# Patient Record
Sex: Female | Born: 1975 | Race: Black or African American | Hispanic: No | Marital: Single | Smoking: Current some day smoker
Health system: Southern US, Community
[De-identification: ages and names within clinical notes are randomized; demographics above are authoritative.]

## PROBLEM LIST (undated history)

## (undated) DIAGNOSIS — N83209 Unspecified ovarian cyst, unspecified side: Secondary | ICD-10-CM

## (undated) HISTORY — PX: ABDOMINAL HYSTERECTOMY: SHX81

## (undated) HISTORY — PX: CHOLECYSTECTOMY: SHX55

## (undated) HISTORY — PX: OVARIAN CYST SURGERY: SHX726

---

## 2011-08-26 ENCOUNTER — Encounter: Payer: Self-pay | Admitting: *Deleted

## 2011-08-26 ENCOUNTER — Emergency Department (HOSPITAL_BASED_OUTPATIENT_CLINIC_OR_DEPARTMENT_OTHER)
Admission: EM | Admit: 2011-08-26 | Discharge: 2011-08-26 | Disposition: A | Discharge status: Home / Self Care | Payer: Self-pay | Attending: Emergency Medicine | Admitting: Emergency Medicine

## 2011-08-26 DIAGNOSIS — F17200 Nicotine dependence, unspecified, uncomplicated: Secondary | ICD-10-CM | POA: Insufficient documentation

## 2011-08-26 DIAGNOSIS — J029 Acute pharyngitis, unspecified: Secondary | ICD-10-CM | POA: Insufficient documentation

## 2011-08-26 LAB — RAPID STREP SCREEN (MED CTR MEBANE ONLY): Streptococcus, Group A Screen (Direct): NEGATIVE

## 2011-08-26 MED ORDER — HYDROCODONE-ACETAMINOPHEN 7.5-500 MG/15ML PO SOLN: 10.0000 mL | ORAL | Status: AC | PRN

## 2011-08-26 MED ORDER — DEXAMETHASONE 4 MG PO TABS
10.0000 mg | ORAL_TABLET | Freq: Once | ORAL | Status: AC
  Administered 2011-08-26: 10 mg via ORAL
  Filled 2011-08-26: qty 3

## 2011-08-26 NOTE — ED Provider Notes (Signed)
History     CSN: [id number] Arrival date & time: 08/26/2011  2:59 AM  Chief Complaint  Patient presents with  . Sore Throat   HPI   History reviewed. No pertinent past medical history.  History reviewed. No pertinent past surgical history.  History reviewed. No pertinent family history.  History  Substance Use Topics  . Smoking status: Current Everyday Smoker -- 2.0 packs/day    Types: Cigarettes  . Smokeless tobacco: Not on file  . Alcohol Use: No    OB History    Grav Para Term Preterm Abortions TAB SAB Ect Mult Living                  Review of Systems  Physical Exam  BP 108/64  Pulse 72  Temp(Src) 98.2 F (36.8 C) (Oral)  Resp 18  SpO2 97%  Physical Exam  ED Course  Procedures  MDM Nursing notes and vitals signs, including pulse oximetry, reviewed.  Summary of this visit's results, reviewed by myself:  Labs:  Results for orders placed during the hospital encounter of 08/26/11  RAPID STREP SCREEN      Component Value Range   Streptococcus, Group A Screen (Direct) NEGATIVE  NEGATIVE     Imaging Studies: No results found.        Hanley Seamen, MD 08/26/11 [phone]

## 2011-08-26 NOTE — ED Notes (Signed)
Pt c/o sore throat for the last 5 days.  Minimal redness noted.  Pt is afebrile and took no otc meds pta.

## 2011-08-26 NOTE — ED Notes (Signed)
Pt presents to ED today with sore throat for the last 5 days.  Pt has taken no meds PTA for relief of sx.

## 2012-09-21 ENCOUNTER — Emergency Department (HOSPITAL_BASED_OUTPATIENT_CLINIC_OR_DEPARTMENT_OTHER)
Admission: EM | Admit: 2012-09-21 | Discharge: 2012-09-22 | Disposition: A | Discharge status: Home / Self Care | Payer: Self-pay | Attending: Emergency Medicine | Admitting: Emergency Medicine

## 2012-09-21 ENCOUNTER — Encounter (HOSPITAL_BASED_OUTPATIENT_CLINIC_OR_DEPARTMENT_OTHER): Payer: Self-pay | Admitting: *Deleted

## 2012-09-21 DIAGNOSIS — M62838 Other muscle spasm: Secondary | ICD-10-CM | POA: Insufficient documentation

## 2012-09-21 DIAGNOSIS — F17200 Nicotine dependence, unspecified, uncomplicated: Secondary | ICD-10-CM | POA: Insufficient documentation

## 2012-09-21 MED ORDER — NAPROXEN 375 MG PO TABS: 375.0000 mg | ORAL_TABLET | Freq: Two times a day (BID) | ORAL | Status: DC

## 2012-09-21 MED ORDER — METHOCARBAMOL 500 MG PO TABS: 500.0000 mg | ORAL_TABLET | Freq: Two times a day (BID) | ORAL | Status: DC

## 2012-09-21 NOTE — ED Notes (Signed)
Pt c/o right shoulder and arm pain x 2 weeks. Denies other s/s. No known injury.

## 2012-09-21 NOTE — ED Provider Notes (Signed)
History  This chart was scribed for Henleigh Robello Smitty Cords, MD by Shari Heritage. The patient was seen in room MH10/MH10. Patient's care was started at 2330.     CSN: [id number]  Arrival date & time 09/21/12  2304   First MD Initiated Contact with Patient 09/21/12 2330      Chief Complaint  Patient presents with  . Shoulder Pain    Patient is a 36 y.o. female presenting with shoulder pain. The history is provided by the patient. No language interpreter was used.  Shoulder Pain This is a new problem. The current episode started more than 1 week ago. The problem occurs constantly. The problem has not changed since onset.Pertinent negatives include no chest pain and no shortness of breath. The symptoms are aggravated by twisting. Nothing relieves the symptoms. She has tried nothing for the symptoms. The treatment provided no relief.    HPI Comments: Katniss Weedman is a 36 y.o. female who presents to the Emergency Department complaining of moderate to severe, persistent right shoulder and right upper arm pain onset 2 weeks ago. Patient denies any obvious trauma or injury. She states that her neck pain is worse with movement. Patient denies any other pain or symptoms. No chest pain, SOB, cough, wheezing, weakness, numbness, leg swelling, or leg pain. Patient hasn't taken any medications or applied heat or ice for symptom relief. She is a current everyday smoker.  History reviewed. No pertinent past medical history.  History reviewed. No pertinent past surgical history.  History reviewed. No pertinent family history.  History  Substance Use Topics  . Smoking status: Current Every Day Smoker -- 2.0 packs/day    Types: Cigarettes  . Smokeless tobacco: Not on file  . Alcohol Use: No    OB History    Grav Para Term Preterm Abortions TAB SAB Ect Mult Living                  Review of Systems  Respiratory: Negative for cough, chest tightness, shortness of breath and wheezing.     Cardiovascular: Negative for chest pain, palpitations and leg swelling.  Musculoskeletal: Positive for myalgias.  Neurological: Negative for weakness and numbness.  All other systems reviewed and are negative.    Allergies  Review of patient's allergies indicates no known allergies.  Home Medications  No current outpatient prescriptions on file.  BP 106/73  Pulse 76  Temp 98.8 F (37.1 C) (Oral)  Resp 20  Ht 5' (1.524 m)  Wt 160 lb (72.576 kg)  BMI 31.25 kg/m2  SpO2 100%  Physical Exam  Constitutional: She is oriented to person, place, and time. She appears well-developed and well-nourished.  HENT:  Head: Normocephalic and atraumatic.  Mouth/Throat: Oropharynx is clear and moist. No oropharyngeal exudate.  Eyes: EOM are normal. Pupils are equal, round, and reactive to light.  Neck: Normal range of motion. Neck supple.  Cardiovascular: Normal rate, regular rhythm, normal heart sounds and intact distal pulses.   No murmur heard. Pulmonary/Chest: Effort normal and breath sounds normal. No respiratory distress. She has no wheezes. She has no rales.  Abdominal: Soft. Bowel sounds are normal. She exhibits no distension. There is no tenderness. There is no rebound.  Musculoskeletal: She exhibits tenderness.       Right shoulder: She exhibits pain.       Pain along border of the right trapezius muscle. Movement of the shoulder makes it worse. Pain with palpation of the deltoid. Palpable spasm in the right trapezius.  Neurological: She is alert and oriented to person, place, and time. She displays normal reflexes.       5/5 grip strength. Neurovascularly intact distally.  Skin: Skin is warm and dry.  Psychiatric: She has a normal mood and affect. Her behavior is normal.    ED Course  Procedures (including critical care time) DIAGNOSTIC STUDIES: Oxygen Saturation is 100% on room air, normal by my interpretation.    COORDINATION OF CARE: 11:43pm- Patient informed of current  plan for treatment and evaluation and agrees with plan at this time.      Labs Reviewed - No data to display No results found.   No diagnosis found.    MDM  Return for chest pain, shortness of breath, weakness, numbness, leg swelling, leg pain or worsening symptoms.    I personally performed the services described in this documentation, which was scribed in my presence. The recorded information has been reviewed and considered.     Wilder Amodei Smitty Cords, MD 09/22/12 [phone]

## 2012-10-13 ENCOUNTER — Encounter (HOSPITAL_BASED_OUTPATIENT_CLINIC_OR_DEPARTMENT_OTHER): Payer: Self-pay | Admitting: *Deleted

## 2012-10-13 ENCOUNTER — Emergency Department (HOSPITAL_BASED_OUTPATIENT_CLINIC_OR_DEPARTMENT_OTHER)
Admission: EM | Admit: 2012-10-13 | Discharge: 2012-10-13 | Disposition: A | Discharge status: Home / Self Care | Payer: Self-pay | Attending: Emergency Medicine | Admitting: Emergency Medicine

## 2012-10-13 DIAGNOSIS — N949 Unspecified condition associated with female genital organs and menstrual cycle: Secondary | ICD-10-CM | POA: Insufficient documentation

## 2012-10-13 DIAGNOSIS — R102 Pelvic and perineal pain: Secondary | ICD-10-CM

## 2012-10-13 DIAGNOSIS — F17200 Nicotine dependence, unspecified, uncomplicated: Secondary | ICD-10-CM | POA: Insufficient documentation

## 2012-10-13 HISTORY — DX: Unspecified ovarian cyst, unspecified side: N83.209

## 2012-10-13 LAB — URINALYSIS, ROUTINE W REFLEX MICROSCOPIC
Leukocytes, UA: NEGATIVE
Nitrite: NEGATIVE
Protein, ur: NEGATIVE mg/dL
Specific Gravity, Urine: 1.026 (ref 1.005–1.030)
Urobilinogen, UA: 1 mg/dL (ref 0.0–1.0)

## 2012-10-13 LAB — WET PREP, GENITAL
WBC, Wet Prep HPF POC: NONE SEEN
Yeast Wet Prep HPF POC: NONE SEEN

## 2012-10-13 LAB — PREGNANCY, URINE: Preg Test, Ur: NEGATIVE

## 2012-10-13 NOTE — ED Notes (Signed)
Pt describes RLQ pain, burning with urination and white vaginal d/c x 3 days. Hx cyst on ovary and had surgery for same. Also has hx of UTIs

## 2012-10-13 NOTE — ED Provider Notes (Signed)
History     CSN: [id number]  Arrival date & time 10/13/12  1844   First MD Initiated Contact with Patient 10/13/12 1953      Chief Complaint  Patient presents with  . Abdominal Pain    (Consider location/radiation/quality/duration/timing/severity/associated sxs/prior treatment) Patient is a 36 y.o. female presenting with abdominal pain. The history is provided by the patient.  Abdominal Pain The primary symptoms of the illness include abdominal pain, dysuria and vaginal discharge. The primary symptoms of the illness do not include fever, shortness of breath, nausea or vomiting.  The vaginal discharge is associated with dysuria.   Associated symptoms comments: Lower abdominal pain and vaginal discharge for 3 days. She reports some dysuria as well as malodor to discharge. No N, V, fever. .    Past Medical History  Diagnosis Date  . Ovarian cyst     Past Surgical History  Procedure Date  . Ovarian cyst surgery     History reviewed. No pertinent family history.  History  Substance Use Topics  . Smoking status: Current Every Day Smoker -- 2.0 packs/day    Types: Cigarettes  . Smokeless tobacco: Not on file  . Alcohol Use: No    OB History    Grav Para Term Preterm Abortions TAB SAB Ect Mult Living                  Review of Systems  Constitutional: Negative for fever.  Respiratory: Negative for shortness of breath.   Cardiovascular: Negative for chest pain.  Gastrointestinal: Positive for abdominal pain. Negative for nausea and vomiting.  Genitourinary: Positive for dysuria and vaginal discharge.    Allergies  Review of patient's allergies indicates no known allergies.  Home Medications   Current Outpatient Rx  Name Route Sig Dispense Refill  . METHOCARBAMOL 500 MG PO TABS Oral Take 1 tablet (500 mg total) by mouth 2 (two) times daily. 20 tablet 0  . NAPROXEN 375 MG PO TABS Oral Take 1 tablet (375 mg total) by mouth 2 (two) times daily. 20 tablet 0     BP 116/69  Pulse 74  Temp 98.8 F (37.1 C) (Oral)  Resp 24  Ht 5' (1.524 m)  Wt 160 lb (72.576 kg)  BMI 31.25 kg/m2  SpO2 100%  Physical Exam  Constitutional: She is oriented to person, place, and time. She appears well-developed and well-nourished. No distress.  Abdominal: Soft. Bowel sounds are normal. She exhibits no mass. There is no rebound and no guarding.       Right lower and suprapubic abdominal tenderness that is mild. No rebound or guarding.  Genitourinary:       White vaginal discharge without purulence. Cervix is unremarkable in appearance and nontender. Mild uterine and right adnexal tenderness. No mass.  Musculoskeletal: Normal range of motion.  Neurological: She is alert and oriented to person, place, and time.    ED Course  Procedures (including critical care time)  Labs Reviewed  WET PREP, GENITAL - Abnormal; Notable for the following:    Clue Cells Wet Prep HPF POC FEW (*)     All other components within normal limits  URINALYSIS, ROUTINE W REFLEX MICROSCOPIC  PREGNANCY, URINE   No results found.   No diagnosis found.  1. Pelvic pain  MDM  Re-evaluation - patient completely nontender. No anorexia, nausea, or persistent pain - doubt appy. She has vaginal discharge that but wet prep and urinalysis are essentially negative. Discussed that appendicitis could not be ruled out  but that she was stable for discharge and should return immediately if there is recurrent pain, fever, loss of appetite, vomiting or new concern.        Rodena Medin, PA-C 10/18/12 [phone]

## 2012-10-18 NOTE — ED Provider Notes (Signed)
Medical screening examination/treatment/procedure(s) were performed by non-physician practitioner and as supervising physician I was immediately available for consultation/collaboration.   Charles B. Bernette Mayers, MD 10/18/12 1010

## 2012-12-15 ENCOUNTER — Emergency Department (HOSPITAL_BASED_OUTPATIENT_CLINIC_OR_DEPARTMENT_OTHER)
Admission: EM | Admit: 2012-12-15 | Discharge: 2012-12-15 | Disposition: A | Discharge status: Home / Self Care | Payer: Self-pay | Attending: Emergency Medicine | Admitting: Emergency Medicine

## 2012-12-15 ENCOUNTER — Encounter (HOSPITAL_BASED_OUTPATIENT_CLINIC_OR_DEPARTMENT_OTHER): Payer: Self-pay | Admitting: *Deleted

## 2012-12-15 DIAGNOSIS — Z8742 Personal history of other diseases of the female genital tract: Secondary | ICD-10-CM | POA: Insufficient documentation

## 2012-12-15 DIAGNOSIS — Z79899 Other long term (current) drug therapy: Secondary | ICD-10-CM | POA: Insufficient documentation

## 2012-12-15 DIAGNOSIS — F17200 Nicotine dependence, unspecified, uncomplicated: Secondary | ICD-10-CM | POA: Insufficient documentation

## 2012-12-15 DIAGNOSIS — N390 Urinary tract infection, site not specified: Secondary | ICD-10-CM | POA: Insufficient documentation

## 2012-12-15 DIAGNOSIS — R300 Dysuria: Secondary | ICD-10-CM | POA: Insufficient documentation

## 2012-12-15 DIAGNOSIS — R350 Frequency of micturition: Secondary | ICD-10-CM | POA: Insufficient documentation

## 2012-12-15 DIAGNOSIS — Z791 Long term (current) use of non-steroidal anti-inflammatories (NSAID): Secondary | ICD-10-CM | POA: Insufficient documentation

## 2012-12-15 LAB — URINE MICROSCOPIC-ADD ON

## 2012-12-15 LAB — URINALYSIS, ROUTINE W REFLEX MICROSCOPIC
Bilirubin Urine: NEGATIVE
Glucose, UA: NEGATIVE mg/dL
Hgb urine dipstick: NEGATIVE
Ketones, ur: NEGATIVE mg/dL
Nitrite: NEGATIVE
Specific Gravity, Urine: 1.023 (ref 1.005–1.030)
pH: 6.5 (ref 5.0–8.0)

## 2012-12-15 MED ORDER — PHENAZOPYRIDINE HCL 200 MG PO TABS: 200.0000 mg | ORAL_TABLET | Freq: Two times a day (BID) | ORAL | Status: DC | PRN

## 2012-12-15 MED ORDER — SULFAMETHOXAZOLE-TRIMETHOPRIM 800-160 MG PO TABS: 1.0000 | ORAL_TABLET | Freq: Two times a day (BID) | ORAL | Status: DC

## 2012-12-15 NOTE — ED Provider Notes (Signed)
History  This chart was scribed for Geoffery Lyons, MD by Shari Heritage, ED Scribe. The patient was seen in room MH04/MH04. Patient's care was started at 1515.  CSN: [id number]  Arrival date & time 12/15/12  1342   First MD Initiated Contact with Patient 12/15/12 1515      Chief Complaint  Patient presents with  . Hematuria    The history is provided by the patient. No language interpreter was used.    HPI Comments: Natasha Nolan is a 37 y.o. female who presents to the Emergency Department complaining of persistent hematuria and odorous white vaginal discharge onset 4 days ago. Patient states that she has a history of UTIs, but she hasn't had one recently. There is associated increased urinary frequency, dysuria and mild suprapubic pressure with urination. Patient denies fever, nausea, vomiting, abdominal pain. Patient reports no other symptoms. Patient does not have any allergies to medications. Past medical history includes ovarian cyst. She is a current every day smoker.  Past Medical History  Diagnosis Date  . Ovarian cyst     Past Surgical History  Procedure Date  . Ovarian cyst surgery     History reviewed. No pertinent family history.  History  Substance Use Topics  . Smoking status: Current Every Day Smoker -- 2.0 packs/day    Types: Cigarettes  . Smokeless tobacco: Not on file  . Alcohol Use: No    OB History    Grav Para Term Preterm Abortions TAB SAB Ect Mult Living                  Review of Systems  Constitutional: Negative for fever.  Gastrointestinal: Negative for nausea, vomiting and abdominal pain.  Genitourinary: Positive for dysuria, frequency and hematuria.  All other systems reviewed and are negative.    Allergies  Review of patient's allergies indicates no known allergies.  Home Medications   Current Outpatient Rx  Name  Route  Sig  Dispense  Refill  . METHOCARBAMOL 500 MG PO TABS   Oral   Take 1 tablet (500 mg total) by mouth 2  (two) times daily.   20 tablet   0   . NAPROXEN 375 MG PO TABS   Oral   Take 1 tablet (375 mg total) by mouth 2 (two) times daily.   20 tablet   0     Triage Vitals: BP 108/67  Pulse 66  Temp 99.1 F (37.3 C) (Oral)  Resp 20  Ht 5' (1.524 m)  Wt 155 lb (70.308 kg)  BMI 30.27 kg/m2  SpO2 100%  Physical Exam  Constitutional: She is oriented to person, place, and time. She appears well-developed and well-nourished. No distress.  HENT:  Head: Normocephalic and atraumatic.  Eyes: Conjunctivae normal and EOM are normal. Pupils are equal, round, and reactive to light.  Neck: Neck supple.  Cardiovascular: Normal rate and regular rhythm.   No murmur heard. Pulmonary/Chest: Effort normal and breath sounds normal. No respiratory distress. She has no wheezes. She has no rales.  Abdominal: Soft. Bowel sounds are normal. She exhibits no distension. There is tenderness (mild) in the suprapubic area. There is no rebound and no guarding.  Musculoskeletal: Normal range of motion. She exhibits no edema and no tenderness.  Neurological: She is alert and oriented to person, place, and time.  Skin: Skin is warm and dry. No rash noted.    ED Course  Procedures (including critical care time) DIAGNOSTIC STUDIES: Oxygen Saturation is 100% on room air,  normal by my interpretation.    COORDINATION OF CARE: 3:20 PM- Patient informed of current plan for treatment and evaluation and agrees with plan at this time. Patient's symptoms and lab results are consistent with a UTI. Will discharge with prescriptions for sulfamethoxazole-trimethoprim (SEPTRA DS) 800-160 MG per tablet 2 times daily and phenazopyridine (PYRIDIUM) 200 MG tablet 2 times daily PRN.   Results for orders placed during the hospital encounter of 12/15/12  URINALYSIS, ROUTINE W REFLEX MICROSCOPIC      Component Value Range   Color, Urine YELLOW  YELLOW   APPearance CLEAR  CLEAR   Specific Gravity, Urine 1.023  1.005 - 1.030   pH  6.5  5.0 - 8.0   Glucose, UA NEGATIVE  NEGATIVE mg/dL   Hgb urine dipstick NEGATIVE  NEGATIVE   Bilirubin Urine NEGATIVE  NEGATIVE   Ketones, ur NEGATIVE  NEGATIVE mg/dL   Protein, ur NEGATIVE  NEGATIVE mg/dL   Urobilinogen, UA 1.0  0.0 - 1.0 mg/dL   Nitrite NEGATIVE  NEGATIVE   Leukocytes, UA MODERATE (*) NEGATIVE  URINE MICROSCOPIC-ADD ON      Component Value Range   Squamous Epithelial / LPF FEW (*) RARE   WBC, UA 21-50  <3 WBC/hpf   RBC / HPF 3-6  <3 RBC/hpf   Bacteria, UA MANY (*) RARE    No results found.   1. UTI (urinary tract infection)       MDM  UTI diagnostic of uti.  Will treat with bactrim, pyridium.  Return prn.      I personally performed the services described in this documentation, which was scribed in my presence. The recorded information has been reviewed and is accurate.      Geoffery Lyons, MD 12/15/12 2130

## 2012-12-15 NOTE — ED Notes (Signed)
Blood in urine and white vaginal d/c with odor x 4 days. Hx UTI's

## 2012-12-17 LAB — URINE CULTURE

## 2012-12-18 NOTE — ED Notes (Signed)
+   Urine Patient treated with Septra-sensitive to same-chart appended per protocol MD. 

## 2013-02-17 ENCOUNTER — Emergency Department (HOSPITAL_BASED_OUTPATIENT_CLINIC_OR_DEPARTMENT_OTHER)
Admission: EM | Admit: 2013-02-17 | Discharge: 2013-02-17 | Disposition: A | Discharge status: Home / Self Care | Payer: Self-pay | Attending: Emergency Medicine | Admitting: Emergency Medicine

## 2013-02-17 ENCOUNTER — Encounter (HOSPITAL_BASED_OUTPATIENT_CLINIC_OR_DEPARTMENT_OTHER): Payer: Self-pay | Admitting: *Deleted

## 2013-02-17 DIAGNOSIS — R51 Headache: Secondary | ICD-10-CM | POA: Insufficient documentation

## 2013-02-17 DIAGNOSIS — B9789 Other viral agents as the cause of diseases classified elsewhere: Secondary | ICD-10-CM | POA: Insufficient documentation

## 2013-02-17 DIAGNOSIS — Z8742 Personal history of other diseases of the female genital tract: Secondary | ICD-10-CM | POA: Insufficient documentation

## 2013-02-17 DIAGNOSIS — F17200 Nicotine dependence, unspecified, uncomplicated: Secondary | ICD-10-CM | POA: Insufficient documentation

## 2013-02-17 DIAGNOSIS — J3489 Other specified disorders of nose and nasal sinuses: Secondary | ICD-10-CM | POA: Insufficient documentation

## 2013-02-17 DIAGNOSIS — B349 Viral infection, unspecified: Secondary | ICD-10-CM

## 2013-02-17 DIAGNOSIS — Z79899 Other long term (current) drug therapy: Secondary | ICD-10-CM | POA: Insufficient documentation

## 2013-02-17 DIAGNOSIS — R61 Generalized hyperhidrosis: Secondary | ICD-10-CM | POA: Insufficient documentation

## 2013-02-17 DIAGNOSIS — R110 Nausea: Secondary | ICD-10-CM | POA: Insufficient documentation

## 2013-02-17 DIAGNOSIS — R630 Anorexia: Secondary | ICD-10-CM | POA: Insufficient documentation

## 2013-02-17 DIAGNOSIS — R52 Pain, unspecified: Secondary | ICD-10-CM | POA: Insufficient documentation

## 2013-02-17 DIAGNOSIS — R5381 Other malaise: Secondary | ICD-10-CM | POA: Insufficient documentation

## 2013-02-17 DIAGNOSIS — R6883 Chills (without fever): Secondary | ICD-10-CM | POA: Insufficient documentation

## 2013-02-17 DIAGNOSIS — R509 Fever, unspecified: Secondary | ICD-10-CM | POA: Insufficient documentation

## 2013-02-17 MED ORDER — HYDROCOD POLST-CHLORPHEN POLST 10-8 MG/5ML PO LQCR: 5.0000 mL | Freq: Two times a day (BID) | ORAL | Status: DC | PRN

## 2013-02-17 MED ORDER — PROMETHAZINE HCL 25 MG PO TABS
25.0000 mg | ORAL_TABLET | Freq: Four times a day (QID) | ORAL | Status: DC | PRN
Start: 2013-02-17 — End: 2018-02-10

## 2013-02-17 NOTE — ED Provider Notes (Signed)
History     CSN: [id number]  Arrival date & time 02/17/13  4098   First MD Initiated Contact with Patient 02/17/13 [phone]      Chief Complaint  Patient presents with  . URI    (Consider location/radiation/quality/duration/timing/severity/associated sxs/prior treatment) HPI Comments: 37 yo female presents with nausea, generalized body aches and chills x 2 days. She also describes having a headache, non-productive cough, clear nasal discharge, anorexia and diaphoresis. Her pain is dull and intermittent, progressively worsening and she rates it a 7/10 on severity. Her cough is worse at night and disturbing her sleep. She has had recent exposure to illness and describes having similar symptoms in the past. She has had her annual influenza vaccine.There are no aggravating factors. She has used Tylenol and Theraflu and has experienced mild relief. Pertinent negatives: sore throat, vomiting, diarrhea, abdominal pain, hemoptysis, shortness of breath, chest pain, melena, hematochezia, dysuria and flank pain.   Patient is a 37 y.o. female presenting with URI. The history is provided by the patient.  URI Presenting symptoms: congestion, cough, fatigue, fever and rhinorrhea   Associated symptoms: headaches and myalgias     Past Medical History  Diagnosis Date  . Ovarian cyst     Past Surgical History  Procedure Laterality Date  . Ovarian cyst surgery    . Abdominal hysterectomy      partial    No family history on file.  History  Substance Use Topics  . Smoking status: Current Every Day Smoker -- 2.00 packs/day    Types: Cigarettes  . Smokeless tobacco: Not on file  . Alcohol Use: No    OB History   Grav Para Term Preterm Abortions TAB SAB Ect Mult Living                  Review of Systems  Constitutional: Positive for fever, chills, diaphoresis, appetite change and fatigue.  HENT: Positive for congestion and rhinorrhea.   Eyes: Negative for discharge.  Respiratory: Positive  for cough. Negative for shortness of breath.   Cardiovascular: Negative for chest pain.  Gastrointestinal: Positive for nausea. Negative for vomiting, abdominal pain, diarrhea and blood in stool.  Genitourinary: Negative for dysuria, urgency and flank pain.  Musculoskeletal: Positive for myalgias.  Neurological: Positive for headaches.    Allergies  Review of patient's allergies indicates no known allergies.  Home Medications   Current Outpatient Rx  Name  Route  Sig  Dispense  Refill  . Acetaminophen (TYLENOL PO)   Oral   Take by mouth.         . chlorpheniramine-HYDROcodone (TUSSIONEX PENNKINETIC ER) 10-8 MG/5ML LQCR   Oral   Take 5 mLs by mouth every 12 (twelve) hours as needed.   80 mL   0   . methocarbamol (ROBAXIN) 500 MG tablet   Oral   Take 1 tablet (500 mg total) by mouth 2 (two) times daily.   20 tablet   0   . naproxen (NAPROSYN) 375 MG tablet   Oral   Take 1 tablet (375 mg total) by mouth 2 (two) times daily.   20 tablet   0   . phenazopyridine (PYRIDIUM) 200 MG tablet   Oral   Take 1 tablet (200 mg total) by mouth 2 (two) times daily as needed for pain.   5 tablet   0   . promethazine (PHENERGAN) 25 MG tablet   Oral   Take 1 tablet (25 mg total) by mouth every 6 (six) hours as  needed for nausea.   20 tablet   0   . sulfamethoxazole-trimethoprim (SEPTRA DS) 800-160 MG per tablet   Oral   Take 1 tablet by mouth 2 (two) times daily.   10 tablet   0     BP 104/58  Pulse 73  Temp(Src) 98.2 F (36.8 C) (Oral)  Resp 20  Ht 5\' 1"  (1.549 m)  Wt 150 lb (68.04 kg)  BMI 28.36 kg/m2  SpO2 100%  Physical Exam  Constitutional: She is oriented to person, place, and time. She appears well-developed and well-nourished. No distress.  HENT:  Head: Normocephalic and atraumatic.  Right Ear: External ear normal.  Left Ear: External ear normal.  Mouth/Throat: Oropharynx is clear and moist. No oropharyngeal exudate.  The nasal turbinates are  erythematous with clear discharge.   Eyes: Conjunctivae and EOM are normal. Pupils are equal, round, and reactive to light. Right eye exhibits no discharge. Left eye exhibits no discharge. No scleral icterus.  Neck: Normal range of motion. Neck supple.  Cardiovascular: Normal rate, regular rhythm and normal heart sounds.  Exam reveals no gallop and no friction rub.   No murmur heard. Pulmonary/Chest: Effort normal and breath sounds normal. No respiratory distress. She has no wheezes. She has no rales. She exhibits no tenderness.  Abdominal: Soft.  Musculoskeletal: Normal range of motion.  Lymphadenopathy:    She has no cervical adenopathy.  Neurological: She is alert and oriented to person, place, and time.  Skin: Skin is warm and dry. She is not diaphoretic.  Psychiatric: She has a normal mood and affect. Her behavior is normal. Judgment and thought content normal.    ED Course  Procedures (including critical care time)  Labs Reviewed - No data to display No results found.   1. Viral syndrome     ra sat is 100% and I intrepret to be normal  MDM  Pt with either mild influenza symptoms or simple viral syndrome of another cause.  She has had vaccine this year.  Pt is concerned, needs work notice, also cough and congestion is keeping her up at night, pain is a 7/10 level.    Will provide tussionex for symptoms, phenergan tablets, and work note for 3 days, she may return to work once no longer febrile.  Pt verbalizes understanding and agrees with plan.        Gavin Pound. Jakarie Pember, MD 02/17/13 1005

## 2013-02-17 NOTE — ED Notes (Signed)
Patient c/o cold symptoms for the past two days, body ache, non productive cough, sinus congestion, runny nose, fever 101 degrees this am took tylenol & theraflu

## 2013-02-17 NOTE — Discharge Instructions (Signed)
 Viral Syndrome You or your child has Viral Syndrome. It is the most common infection causing colds and infections in the nose, throat, sinuses, and breathing tubes. Sometimes the infection causes nausea, vomiting, or diarrhea. The germ that causes the infection is a virus. No antibiotic or other medicine will kill it. There are medicines that you can take to make you or your child more comfortable.  HOME CARE INSTRUCTIONS   Rest in bed until you start to feel better.  If you have diarrhea or vomiting, eat small amounts of crackers and toast. Soup is helpful.  Do not give aspirin or medicine that contains aspirin to children.  Only take over-the-counter or prescription medicines for pain, discomfort, or fever as directed by your caregiver. SEEK IMMEDIATE MEDICAL CARE IF:   You or your child has not improved within one week.  You or your child has pain that is not at least partially relieved by over-the-counter medicine.  Thick, colored mucus or blood is coughed up.  Discharge from the nose becomes thick yellow or green.  Diarrhea or vomiting gets worse.  There is any major change in your or your child's condition.  You or your child develops a skin rash, stiff neck, severe headache, or are unable to hold down food or fluid.  Document Released: 11/26/2006 Document Revised: 03/04/2012 Document Reviewed: 11/27/2007 [facility] Patient Information 2013 [place], [place].

## 2013-05-31 DIAGNOSIS — F17200 Nicotine dependence, unspecified, uncomplicated: Secondary | ICD-10-CM | POA: Insufficient documentation

## 2013-05-31 DIAGNOSIS — R52 Pain, unspecified: Secondary | ICD-10-CM | POA: Insufficient documentation

## 2013-05-31 DIAGNOSIS — Z8742 Personal history of other diseases of the female genital tract: Secondary | ICD-10-CM | POA: Insufficient documentation

## 2013-05-31 DIAGNOSIS — M25569 Pain in unspecified knee: Secondary | ICD-10-CM | POA: Insufficient documentation

## 2013-05-31 DIAGNOSIS — M7989 Other specified soft tissue disorders: Secondary | ICD-10-CM | POA: Insufficient documentation

## 2013-06-01 ENCOUNTER — Ambulatory Visit (HOSPITAL_BASED_OUTPATIENT_CLINIC_OR_DEPARTMENT_OTHER)
Admit: 2013-06-01 | Discharge: 2013-06-01 | Disposition: A | Discharge status: Home / Self Care | Payer: Self-pay | Attending: Emergency Medicine | Admitting: Emergency Medicine

## 2013-06-01 ENCOUNTER — Encounter (HOSPITAL_BASED_OUTPATIENT_CLINIC_OR_DEPARTMENT_OTHER): Payer: Self-pay | Admitting: *Deleted

## 2013-06-01 ENCOUNTER — Emergency Department (HOSPITAL_BASED_OUTPATIENT_CLINIC_OR_DEPARTMENT_OTHER)
Admission: EM | Admit: 2013-06-01 | Discharge: 2013-06-01 | Disposition: A | Discharge status: Home / Self Care | Payer: Self-pay | Attending: Emergency Medicine | Admitting: Emergency Medicine

## 2013-06-01 ENCOUNTER — Emergency Department (HOSPITAL_BASED_OUTPATIENT_CLINIC_OR_DEPARTMENT_OTHER): Admit: 2013-06-01 | Payer: Self-pay

## 2013-06-01 DIAGNOSIS — M25569 Pain in unspecified knee: Secondary | ICD-10-CM | POA: Insufficient documentation

## 2013-06-01 DIAGNOSIS — M25561 Pain in right knee: Secondary | ICD-10-CM

## 2013-06-01 DIAGNOSIS — M222X1 Patellofemoral disorders, right knee: Secondary | ICD-10-CM

## 2013-06-01 MED ORDER — NAPROXEN 500 MG PO TABS: 500.0000 mg | ORAL_TABLET | Freq: Two times a day (BID) | ORAL | Status: DC

## 2013-06-01 MED ORDER — NAPROXEN 250 MG PO TABS
500.0000 mg | ORAL_TABLET | Freq: Two times a day (BID) | ORAL | Status: DC
  Administered 2013-06-01: 500 mg via ORAL

## 2013-06-01 MED ORDER — NAPROXEN 250 MG PO TABS
ORAL_TABLET | ORAL | Status: AC
  Administered 2013-06-01: 500 mg via ORAL
  Filled 2013-06-01: qty 2

## 2013-06-01 NOTE — ED Notes (Signed)
MD at bedside. 

## 2013-06-01 NOTE — ED Notes (Signed)
Right knee pain x 2-1/2 weeks. No known injury. Pain is worse today.

## 2013-06-01 NOTE — ED Provider Notes (Signed)
History     CSN: [id number]  Arrival date & time 05/31/13  2355   None     Chief Complaint  Patient presents with  . Knee Pain    HPI Natasha Nolan is a 37 y.o. female presenting with 2 and half weeks of right knee pain. She says it got worse tonight, she says her some mild swelling, denies any antecedent injury. His knee pain is worse on going up and down stairs, sitting for prolonged periods, worse when she first wakes up in the morning. Is been no associated fevers, chills, myalgias, nausea vomiting, chest pain or shortness of breath.   Past Medical History  Diagnosis Date  . Ovarian cyst     Past Surgical History  Procedure Laterality Date  . Ovarian cyst surgery    . Abdominal hysterectomy      partial    History reviewed. No pertinent family history.  History  Substance Use Topics  . Smoking status: Current Every Day Smoker -- 2.00 packs/day    Types: Cigarettes  . Smokeless tobacco: Not on file  . Alcohol Use: No    OB History   Grav Para Term Preterm Abortions TAB SAB Ect Mult Living                  Review of Systems At least 10pt or greater review of systems completed and are negative except where specified in the HPI.  Allergies  Review of patient's allergies indicates no known allergies.  Home Medications   Current Outpatient Rx  Name  Route  Sig  Dispense  Refill  . Acetaminophen (TYLENOL PO)   Oral   Take by mouth.         . chlorpheniramine-HYDROcodone (TUSSIONEX PENNKINETIC ER) 10-8 MG/5ML LQCR   Oral   Take 5 mLs by mouth every 12 (twelve) hours as needed.   80 mL   0   . methocarbamol (ROBAXIN) 500 MG tablet   Oral   Take 1 tablet (500 mg total) by mouth 2 (two) times daily.   20 tablet   0   . naproxen (NAPROSYN) 375 MG tablet   Oral   Take 1 tablet (375 mg total) by mouth 2 (two) times daily.   20 tablet   0   . phenazopyridine (PYRIDIUM) 200 MG tablet   Oral   Take 1 tablet (200 mg total) by mouth 2 (two) times  daily as needed for pain.   5 tablet   0   . promethazine (PHENERGAN) 25 MG tablet   Oral   Take 1 tablet (25 mg total) by mouth every 6 (six) hours as needed for nausea.   20 tablet   0   . sulfamethoxazole-trimethoprim (SEPTRA DS) 800-160 MG per tablet   Oral   Take 1 tablet by mouth 2 (two) times daily.   10 tablet   0     BP 106/67  Pulse 62  Temp(Src) 98.7 F (37.1 C) (Oral)  Resp 20  Ht 5\' 2"  (1.575 m)  Wt 160 lb (72.576 kg)  BMI 29.26 kg/m2  SpO2 100%  Physical Exam  Nursing notes reviewed.  Electronic medical record reviewed. VITAL SIGNS:   Filed Vitals:   06/01/13 0009  BP: 106/67  Pulse: 62  Temp: 98.7 F (37.1 C)  TempSrc: Oral  Resp: 20  Height: 5\' 2"  (1.575 m)  Weight: 160 lb (72.576 kg)  SpO2: 100%   CONSTITUTIONAL: Awake, oriented, appears non-toxic HENT: Atraumatic, normocephalic, oral mucosa  pink and moist, airway patent. Nares patent without drainage. External ears normal. EYES: Conjunctiva clear, EOMI, PERRLA NECK: Trachea midline, non-tender, supple CARDIOVASCULAR: Normal heart rate, Normal rhythm, No murmurs, rubs, gallops PULMONARY/CHEST: Clear to auscultation, no rhonchi, wheezes, or rales. Symmetrical breath sounds. Non-tender. ABDOMINAL: Non-distended, soft, non-tender - no rebound or guarding.  BS normal. NEUROLOGIC: Non-focal, moving all four extremities, no gross sensory or motor deficits. EXTREMITIES: No clubbing, cyanosis, or edema. No knee effusion noted, some pain noted when moving the patella. Patella is stable, and he knee is stable, McMurray and Lachman negative.  SKIN: Warm, Dry, No erythema, No rash  ED Course  Procedures (including critical care time)  Labs Reviewed - No data to display Dg Knee 2 Views Right  06/01/2013   *RADIOLOGY REPORT*  Clinical Data: Right knee pain.  RIGHT KNEE - 1-2 VIEW  Comparison: None  Findings: There is no evidence of fracture, subluxation or dislocation. The joint space is unremarkable.  There is no evidence of joint effusion. No focal bony lesions are present.  IMPRESSION: Unremarkable exam.   Original Report Authenticated By: Harmon Pier, M.D.     1. PFS (patellofemoral syndrome), right   2. Knee pain, acute, right       MDM  Patient history consistent with PFS. Treat conservatively, given instructions on quad strengthening. No evidence for joint infection at this time. Patient has been reasonably screened for medical emergency which is not present - patient can followup as an outpatient.       Jones Skene, MD 06/01/13 [phone]

## 2013-06-01 NOTE — ED Notes (Signed)
rx x 1 given for naprosyn. Ice pack given for home use

## 2014-03-09 ENCOUNTER — Emergency Department (HOSPITAL_BASED_OUTPATIENT_CLINIC_OR_DEPARTMENT_OTHER)
Admission: EM | Admit: 2014-03-09 | Discharge: 2014-03-09 | Disposition: A | Discharge status: Home / Self Care | Payer: BC Managed Care – PPO | Attending: Emergency Medicine | Admitting: Emergency Medicine

## 2014-03-09 ENCOUNTER — Encounter (HOSPITAL_BASED_OUTPATIENT_CLINIC_OR_DEPARTMENT_OTHER): Payer: Self-pay | Admitting: Emergency Medicine

## 2014-03-09 DIAGNOSIS — R110 Nausea: Secondary | ICD-10-CM | POA: Insufficient documentation

## 2014-03-09 DIAGNOSIS — J029 Acute pharyngitis, unspecified: Secondary | ICD-10-CM | POA: Insufficient documentation

## 2014-03-09 DIAGNOSIS — Z79899 Other long term (current) drug therapy: Secondary | ICD-10-CM | POA: Insufficient documentation

## 2014-03-09 DIAGNOSIS — Z8742 Personal history of other diseases of the female genital tract: Secondary | ICD-10-CM | POA: Insufficient documentation

## 2014-03-09 DIAGNOSIS — R630 Anorexia: Secondary | ICD-10-CM | POA: Insufficient documentation

## 2014-03-09 DIAGNOSIS — F17200 Nicotine dependence, unspecified, uncomplicated: Secondary | ICD-10-CM | POA: Insufficient documentation

## 2014-03-09 DIAGNOSIS — Z792 Long term (current) use of antibiotics: Secondary | ICD-10-CM | POA: Insufficient documentation

## 2014-03-09 DIAGNOSIS — Z791 Long term (current) use of non-steroidal anti-inflammatories (NSAID): Secondary | ICD-10-CM | POA: Insufficient documentation

## 2014-03-09 LAB — RAPID STREP SCREEN (MED CTR MEBANE ONLY): STREPTOCOCCUS, GROUP A SCREEN (DIRECT): NEGATIVE

## 2014-03-09 NOTE — ED Notes (Signed)
Sprite Given. Encouraged to drink Fluids.

## 2014-03-09 NOTE — Discharge Instructions (Signed)
Viral Pharyngitis Viral pharyngitis is a viral infection that produces redness, pain, and swelling (inflammation) of the throat. It can spread from person to person (contagious). CAUSES Viral pharyngitis is caused by inhaling a large amount of certain germs called viruses. Many different viruses cause viral pharyngitis. SYMPTOMS Symptoms of viral pharyngitis include:  Sore throat.  Tiredness.  Stuffy nose.  Low-grade fever.  Congestion.  Cough. TREATMENT Treatment includes rest, drinking plenty of fluids, and the use of over-the-counter medication (approved by your caregiver). HOME CARE INSTRUCTIONS   Drink enough fluids to keep your urine clear or pale yellow.  Eat soft, cold foods such as ice cream, frozen ice pops, or gelatin dessert.  Gargle with warm salt water (1 tsp salt per 1 qt of water).  If over age 7, throat lozenges may be used safely.  Only take over-the-counter or prescription medicines for pain, discomfort, or fever as directed by your caregiver. Do not take aspirin. To help prevent spreading viral pharyngitis to others, avoid:  Mouth-to-mouth contact with others.  Sharing utensils for eating and drinking.  Coughing around others. SEEK MEDICAL CARE IF:   You are better in a few days, then become worse.  You have a fever or pain not helped by pain medicines.  There are any other changes that concern you. Document Released: 09/20/2005 Document Revised: 03/04/2012 Document Reviewed: 02/16/2011 [employer] Patient Information 2014 [employer].  

## 2014-03-09 NOTE — ED Notes (Signed)
4 days of sore throat and ear pain had one day of fever and chills

## 2014-03-09 NOTE — ED Provider Notes (Signed)
I saw and evaluated the patient, reviewed the resident's note and I agree with the findings and plan.   EKG Interpretation None     Pt without signs of exudate on tonsils, voice nl--strep nl  Toy BakerAnthony T Haylo Fake, MD 03/09/14 [phone]

## 2014-03-09 NOTE — ED Notes (Signed)
Pt tolerating oral fluids 

## 2014-03-09 NOTE — ED Provider Notes (Signed)
CSN: [id number]     Arrival date & time 03/09/14  4098 History   First MD Initiated Contact with Patient 03/09/14 [phone]     Chief Complaint  Patient presents with  . Sore Throat  . Otalgia   HPI Natasha Nolan is 38 y.o. female presented the ED with complaints of sore throat and right ear pain for 1 week. She states it has become worse yesterday and she could not take the throat pain any longer. She reports difficulty to swallow and she can not eat or drink without pain. She endorses nausea, without vomiting, and fatigue. Her daughter had sore throat last week. She has been experiencing chills, and fever x1. Denies diarrhea, cough, headache, chest pain or difficulty breathing. No rash. She reports taking nothing for fever or pain.  Past Medical History  Diagnosis Date  . Ovarian cyst    Past Surgical History  Procedure Laterality Date  . Ovarian cyst surgery    . Abdominal hysterectomy      partial   No family history on file. History  Substance Use Topics  . Smoking status: Current Every Day Smoker -- 2.00 packs/day    Types: Cigarettes  . Smokeless tobacco: Not on file  . Alcohol Use: No   OB History   Grav Para Term Preterm Abortions TAB SAB Ect Mult Living                 Review of Systems  Constitutional: Positive for fever, chills, appetite change and fatigue. Negative for diaphoresis, activity change and unexpected weight change.  HENT: Positive for ear pain and sore throat. Negative for congestion, dental problem, ear discharge, nosebleeds, rhinorrhea, sinus pressure, sneezing and tinnitus.   Eyes: Negative for pain, discharge and itching.  Respiratory: Negative for cough, chest tightness, shortness of breath and wheezing.   Cardiovascular: Negative for chest pain and palpitations.  Gastrointestinal: Positive for nausea. Negative for vomiting, abdominal pain, diarrhea and constipation.  Genitourinary: Negative for dysuria.    Allergies  Review of patient's  allergies indicates no known allergies.  Home Medications   Current Outpatient Rx  Name  Route  Sig  Dispense  Refill  . Acetaminophen (TYLENOL PO)   Oral   Take by mouth.         . chlorpheniramine-HYDROcodone (TUSSIONEX PENNKINETIC ER) 10-8 MG/5ML LQCR   Oral   Take 5 mLs by mouth every 12 (twelve) hours as needed.   80 mL   0   . methocarbamol (ROBAXIN) 500 MG tablet   Oral   Take 1 tablet (500 mg total) by mouth 2 (two) times daily.   20 tablet   0   . naproxen (NAPROSYN) 375 MG tablet   Oral   Take 1 tablet (375 mg total) by mouth 2 (two) times daily.   20 tablet   0   . naproxen (NAPROSYN) 500 MG tablet   Oral   Take 1 tablet (500 mg total) by mouth 2 (two) times daily.   30 tablet   0   . phenazopyridine (PYRIDIUM) 200 MG tablet   Oral   Take 1 tablet (200 mg total) by mouth 2 (two) times daily as needed for pain.   5 tablet   0   . promethazine (PHENERGAN) 25 MG tablet   Oral   Take 1 tablet (25 mg total) by mouth every 6 (six) hours as needed for nausea.   20 tablet   0   . sulfamethoxazole-trimethoprim (SEPTRA DS) 800-160 MG  per tablet   Oral   Take 1 tablet by mouth 2 (two) times daily.   10 tablet   0    BP 109/64  Pulse 80  Temp(Src) 98.6 F (37 C) (Oral)  Resp 16  Ht 5\' 1"  (1.549 m)  Wt 160 lb (72.576 kg)  BMI 30.25 kg/m2  SpO2 98% Physical Exam Gen: NAD. Nontoxic. HEENT: AT. . Bilateral TM visualized and normal in appearance. Bilateral eyes without injections, mild icterus. MMM. Bilateral nares with mild erythema. Throat with erythema, no exudates.  CV: RRR, no murmurs appreciated Chest: CTAB, no wheeze or crackles Abd: Soft. NTND. BS present. No Masses palpated.  Ext: No erythema. No edema.  Skin: No rashes, purpura or petechiae. Multiple tattoos. Fresh tattoo on left lateral calf. Neuro: Normal gait. PERLA. EOMi. Alert. Grossly intact.  Psych: Normal affect, mood and demeanor.  ED Course  Procedures (including critical  care time) Labs Review Labs Reviewed - No data to display Imaging Review No results found.   EKG Interpretation None      MDM   Final diagnoses:  None   Patient presents to ED for 1 week history of sore throat ear pain. Patient appeared nontoxic and in no acute distress. Patient is afebrile. Encouraged her to drink plenty of fluids. Rapid strep test negative, likely viral pharyngitis. Pt is discharged with instruction to follow up with family physician within 5 days if not feeling better.    [name] A Kuneff, DO 03/09/14 [phone]

## 2014-03-10 NOTE — ED Provider Notes (Signed)
I saw and evaluated the patient, reviewed the resident's note and I agree with the findings and plan.   EKG Interpretation None       Jakaiya Netherland T Shlomie Romig, MD 03/10/14 0851 

## 2014-03-11 LAB — CULTURE, GROUP A STREP

## 2014-03-12 ENCOUNTER — Encounter (HOSPITAL_BASED_OUTPATIENT_CLINIC_OR_DEPARTMENT_OTHER): Payer: Self-pay | Admitting: Emergency Medicine

## 2014-03-12 ENCOUNTER — Emergency Department (HOSPITAL_BASED_OUTPATIENT_CLINIC_OR_DEPARTMENT_OTHER)
Admission: EM | Admit: 2014-03-12 | Discharge: 2014-03-12 | Disposition: A | Discharge status: Home / Self Care | Payer: BC Managed Care – PPO | Attending: Emergency Medicine | Admitting: Emergency Medicine

## 2014-03-12 DIAGNOSIS — R509 Fever, unspecified: Secondary | ICD-10-CM | POA: Insufficient documentation

## 2014-03-12 DIAGNOSIS — A5901 Trichomonal vulvovaginitis: Secondary | ICD-10-CM

## 2014-03-12 DIAGNOSIS — R05 Cough: Secondary | ICD-10-CM | POA: Insufficient documentation

## 2014-03-12 DIAGNOSIS — R5383 Other fatigue: Secondary | ICD-10-CM

## 2014-03-12 DIAGNOSIS — R197 Diarrhea, unspecified: Secondary | ICD-10-CM | POA: Insufficient documentation

## 2014-03-12 DIAGNOSIS — R059 Cough, unspecified: Secondary | ICD-10-CM | POA: Insufficient documentation

## 2014-03-12 DIAGNOSIS — Z79899 Other long term (current) drug therapy: Secondary | ICD-10-CM | POA: Insufficient documentation

## 2014-03-12 DIAGNOSIS — R1310 Dysphagia, unspecified: Secondary | ICD-10-CM | POA: Insufficient documentation

## 2014-03-12 DIAGNOSIS — R110 Nausea: Secondary | ICD-10-CM | POA: Insufficient documentation

## 2014-03-12 DIAGNOSIS — Z8742 Personal history of other diseases of the female genital tract: Secondary | ICD-10-CM | POA: Insufficient documentation

## 2014-03-12 DIAGNOSIS — R300 Dysuria: Secondary | ICD-10-CM | POA: Insufficient documentation

## 2014-03-12 DIAGNOSIS — H9209 Otalgia, unspecified ear: Secondary | ICD-10-CM

## 2014-03-12 DIAGNOSIS — J029 Acute pharyngitis, unspecified: Secondary | ICD-10-CM | POA: Insufficient documentation

## 2014-03-12 DIAGNOSIS — R5381 Other malaise: Secondary | ICD-10-CM | POA: Insufficient documentation

## 2014-03-12 DIAGNOSIS — R350 Frequency of micturition: Secondary | ICD-10-CM | POA: Insufficient documentation

## 2014-03-12 DIAGNOSIS — F17200 Nicotine dependence, unspecified, uncomplicated: Secondary | ICD-10-CM | POA: Insufficient documentation

## 2014-03-12 LAB — URINALYSIS, ROUTINE W REFLEX MICROSCOPIC
BILIRUBIN URINE: NEGATIVE
Glucose, UA: NEGATIVE mg/dL
Hgb urine dipstick: NEGATIVE
Ketones, ur: NEGATIVE mg/dL
NITRITE: NEGATIVE
PH: 6.5 (ref 5.0–8.0)
Protein, ur: NEGATIVE mg/dL
SPECIFIC GRAVITY, URINE: 1.017 (ref 1.005–1.030)
Urobilinogen, UA: 0.2 mg/dL (ref 0.0–1.0)

## 2014-03-12 LAB — URINE MICROSCOPIC-ADD ON

## 2014-03-12 MED ORDER — METRONIDAZOLE 0.75 % VA GEL: 1.0000 | Freq: Two times a day (BID) | VAGINAL | Status: DC

## 2014-03-12 MED ORDER — LEVOFLOXACIN 250 MG PO TABS: 250.0000 mg | ORAL_TABLET | Freq: Every day | ORAL | Status: DC

## 2014-03-12 NOTE — ED Provider Notes (Signed)
CSN: [id number]     Arrival date & time 03/12/14  1927 History   First MD Initiated Contact with Patient 03/12/14 2009     Chief Complaint  Patient presents with  . URI     (Consider location/radiation/quality/duration/timing/severity/associated sxs/prior Treatment) HPI Comments: Patient is a 38 year-old female presenting with complaints of ear pain, neck pain, difficulty swallowing and dry, non-productive cough x 5 days. Patient also reports diarrhea and dysuria x 1 day. Patient says she has controlled her fever with Tylenol, though it has not helped relieve her other symptoms.   The ear, neck and throat pain are constant and worse with palpation. The patient thinks her difficulty swallowing is due to throat pain.  Patient also complains of diarrhea without blood, dysuria and polydipsia. She has not tried anything to relieve these symptoms. She states there is no way she could have a sexually transmitted infection and reports previously having a partial hysterectomy.   Patient is a 38 y.o. female presenting with URI. The history is provided by the patient. No language interpreter was used.  URI Presenting symptoms: cough, fatigue, fever and sore throat   Presenting symptoms: no congestion, no facial pain and no rhinorrhea   Cough:    Cough characteristics:  Dry   Severity:  Mild   Timing:  Intermittent   Progression:  Unchanged Fatigue:    Severity:  Mild   Timing:  Constant Severity:  Moderate Onset quality:  Gradual Timing:  Constant Relieved by:  Nothing Worsened by:  Drinking and eating Associated symptoms: no headaches, no neck pain, no sinus pain and no swollen glands     Past Medical History  Diagnosis Date  . Ovarian cyst    Past Surgical History  Procedure Laterality Date  . Ovarian cyst surgery    . Abdominal hysterectomy      partial   No family history on file. History  Substance Use Topics  . Smoking status: Current Every Day Smoker -- 2.00 packs/day   Types: Cigarettes  . Smokeless tobacco: Not on file  . Alcohol Use: No   OB History   Grav Para Term Preterm Abortions TAB SAB Ect Mult Living                 Review of Systems  Constitutional: Positive for fever and fatigue. Negative for chills.  HENT: Positive for sore throat and trouble swallowing. Negative for congestion, rhinorrhea and sinus pressure.   Respiratory: Positive for cough. Negative for shortness of breath.   Cardiovascular: Negative for chest pain.  Gastrointestinal: Positive for nausea and diarrhea. Negative for abdominal pain and blood in stool.  Genitourinary: Positive for dysuria and frequency.  Musculoskeletal: Negative for neck pain.  Neurological: Negative for dizziness and headaches.      Allergies  Review of patient's allergies indicates no known allergies.  Home Medications   Current Outpatient Rx  Name  Route  Sig  Dispense  Refill  . Acetaminophen (TYLENOL PO)   Oral   Take by mouth.         . chlorpheniramine-HYDROcodone (TUSSIONEX PENNKINETIC ER) 10-8 MG/5ML LQCR   Oral   Take 5 mLs by mouth every 12 (twelve) hours as needed.   80 mL   0   . methocarbamol (ROBAXIN) 500 MG tablet   Oral   Take 1 tablet (500 mg total) by mouth 2 (two) times daily.   20 tablet   0   . naproxen (NAPROSYN) 375 MG tablet   Oral  Take 1 tablet (375 mg total) by mouth 2 (two) times daily.   20 tablet   0   . naproxen (NAPROSYN) 500 MG tablet   Oral   Take 1 tablet (500 mg total) by mouth 2 (two) times daily.   30 tablet   0   . phenazopyridine (PYRIDIUM) 200 MG tablet   Oral   Take 1 tablet (200 mg total) by mouth 2 (two) times daily as needed for pain.   5 tablet   0   . promethazine (PHENERGAN) 25 MG tablet   Oral   Take 1 tablet (25 mg total) by mouth every 6 (six) hours as needed for nausea.   20 tablet   0   . sulfamethoxazole-trimethoprim (SEPTRA DS) 800-160 MG per tablet   Oral   Take 1 tablet by mouth 2 (two) times daily.   10  tablet   0    BP 112/59  Pulse 81  Temp(Src) 98.9 F (37.2 C) (Oral)  Resp 18  Ht 5\' 1"  (1.549 m)  Wt 160 lb (72.576 kg)  BMI 30.25 kg/m2  SpO2 100% Physical Exam  Constitutional: She is oriented to person, place, and time. She appears well-developed and well-nourished.  HENT:  Head:    Right Ear: There is tenderness. No foreign bodies. Tympanic membrane is not injected and not erythematous. No middle ear effusion.  Left Ear: External ear normal. No tenderness. No foreign bodies. Tympanic membrane is not injected and not erythematous.  No middle ear effusion.  Nose: No rhinorrhea or sinus tenderness.  Mouth/Throat: Uvula is midline, oropharynx is clear and moist and mucous membranes are normal. No uvula swelling.  Eyes: Conjunctivae and EOM are normal. Pupils are equal, round, and reactive to light. Left eye exhibits discharge. No scleral icterus.  Neck: Normal range of motion.    Cardiovascular: Normal rate and normal heart sounds.   Pulmonary/Chest: Effort normal and breath sounds normal. She exhibits no tenderness.  Abdominal: Soft. There is no tenderness. There is no CVA tenderness.  Lymphadenopathy:       Head (right side): No submental, no submandibular, no tonsillar, no preauricular and no posterior auricular adenopathy present.       Head (left side): No submental, no submandibular, no tonsillar, no preauricular and no posterior auricular adenopathy present.    She has no cervical adenopathy.  Neurological: She is alert and oriented to person, place, and time.  Skin: Skin is warm and dry.  Psychiatric: She has a normal mood and affect. Her speech is normal.    ED Course  Procedures (including critical care time) Labs Review Labs Reviewed  URINALYSIS, ROUTINE W REFLEX MICROSCOPIC - Abnormal; Notable for the following:    Leukocytes, UA MODERATE (*)    All other components within normal limits  URINE MICROSCOPIC-ADD ON - Abnormal; Notable for the following:     Squamous Epithelial / LPF FEW (*)    All other components within normal limits   Results for orders placed during the hospital encounter of 03/12/14  URINALYSIS, ROUTINE W REFLEX MICROSCOPIC      Result Value Ref Range   Color, Urine YELLOW  YELLOW   APPearance CLEAR  CLEAR   Specific Gravity, Urine 1.017  1.005 - 1.030   pH 6.5  5.0 - 8.0   Glucose, UA NEGATIVE  NEGATIVE mg/dL   Hgb urine dipstick NEGATIVE  NEGATIVE   Bilirubin Urine NEGATIVE  NEGATIVE   Ketones, ur NEGATIVE  NEGATIVE mg/dL   Protein,  ur NEGATIVE  NEGATIVE mg/dL   Urobilinogen, UA 0.2  0.0 - 1.0 mg/dL   Nitrite NEGATIVE  NEGATIVE   Leukocytes, UA MODERATE (*) NEGATIVE  URINE MICROSCOPIC-ADD ON      Result Value Ref Range   Squamous Epithelial / LPF FEW (*) RARE   WBC, UA 7-10  <3 WBC/hpf   Bacteria, UA RARE  RARE   Urine-Other TRICHOMONAS PRESENT      Imaging Review No results found.   EKG Interpretation None      MDM   Final diagnoses:  None    1. Otalgia 2. Trichomonas vaginitis  Patient encouraged to follow up with primary care. Pain management provided. Return precautions given.     Arnoldo HookerShari A Felina Tello, PA-C 03/16/14 [phone]

## 2014-03-12 NOTE — Discharge Instructions (Signed)
FOLLOW UP WITH DR. ZANARD OR YOUR CHOICE OF PRIMARY CARE PHYSICIAN FOR ROUTINE MEDICAL CONCERNS. RETURN HERE WITH ANY WORSENING SYMPTOMS.

## 2014-03-12 NOTE — ED Notes (Signed)
C/o cont'd fever and is being sent home from work, sore throat and right earache-seen here Sunday for same-also c/o urinary freq

## 2014-03-16 NOTE — ED Provider Notes (Signed)
  Medical screening examination/treatment/procedure(s) were performed by non-physician practitioner and as supervising physician I was immediately available for consultation/collaboration.   EKG Interpretation None         Yaakov Saindon, MD 03/16/14 1532 

## 2014-07-09 ENCOUNTER — Encounter (HOSPITAL_BASED_OUTPATIENT_CLINIC_OR_DEPARTMENT_OTHER): Payer: Self-pay | Admitting: Emergency Medicine

## 2014-07-09 ENCOUNTER — Emergency Department (HOSPITAL_BASED_OUTPATIENT_CLINIC_OR_DEPARTMENT_OTHER)
Admission: EM | Admit: 2014-07-09 | Discharge: 2014-07-09 | Disposition: A | Discharge status: Home / Self Care | Payer: BC Managed Care – PPO | Attending: Emergency Medicine | Admitting: Emergency Medicine

## 2014-07-09 ENCOUNTER — Emergency Department (HOSPITAL_BASED_OUTPATIENT_CLINIC_OR_DEPARTMENT_OTHER): Payer: BC Managed Care – PPO

## 2014-07-09 DIAGNOSIS — J209 Acute bronchitis, unspecified: Secondary | ICD-10-CM | POA: Insufficient documentation

## 2014-07-09 DIAGNOSIS — Z8742 Personal history of other diseases of the female genital tract: Secondary | ICD-10-CM | POA: Insufficient documentation

## 2014-07-09 DIAGNOSIS — Z791 Long term (current) use of non-steroidal anti-inflammatories (NSAID): Secondary | ICD-10-CM | POA: Insufficient documentation

## 2014-07-09 DIAGNOSIS — Z792 Long term (current) use of antibiotics: Secondary | ICD-10-CM | POA: Insufficient documentation

## 2014-07-09 DIAGNOSIS — Z79899 Other long term (current) drug therapy: Secondary | ICD-10-CM | POA: Insufficient documentation

## 2014-07-09 DIAGNOSIS — J40 Bronchitis, not specified as acute or chronic: Secondary | ICD-10-CM

## 2014-07-09 DIAGNOSIS — F17200 Nicotine dependence, unspecified, uncomplicated: Secondary | ICD-10-CM | POA: Insufficient documentation

## 2014-07-09 LAB — URINALYSIS, ROUTINE W REFLEX MICROSCOPIC
BILIRUBIN URINE: NEGATIVE
GLUCOSE, UA: NEGATIVE mg/dL
HGB URINE DIPSTICK: NEGATIVE
KETONES UR: NEGATIVE mg/dL
Leukocytes, UA: NEGATIVE
Nitrite: NEGATIVE
PROTEIN: NEGATIVE mg/dL
Specific Gravity, Urine: 1.016 (ref 1.005–1.030)
UROBILINOGEN UA: 0.2 mg/dL (ref 0.0–1.0)
pH: 5.5 (ref 5.0–8.0)

## 2014-07-09 MED ORDER — ALBUTEROL SULFATE HFA 108 (90 BASE) MCG/ACT IN AERS: 1.0000 | INHALATION_SPRAY | Freq: Four times a day (QID) | RESPIRATORY_TRACT | Status: DC | PRN

## 2014-07-09 MED ORDER — AZITHROMYCIN 250 MG PO TABS: ORAL_TABLET | ORAL | Status: DC

## 2014-07-09 NOTE — ED Provider Notes (Signed)
CSN: [id number]     Arrival date & time 07/09/14  1535 History   First MD Initiated Contact with Patient 07/09/14 1622     Chief Complaint  Patient presents with  . Fever     (Consider location/radiation/quality/duration/timing/severity/associated sxs/prior Treatment) Patient is a 38 y.o. female presenting with fever. The history is provided by the patient. No language interpreter was used.  Fever Temp source:  Subjective Severity:  Moderate Onset quality:  Gradual Duration:  2 days Timing:  Constant Progression:  Worsening Chronicity:  New Relieved by:  Nothing Worsened by:  Nothing tried Associated symptoms: no chest pain   Risk factors: no hx of cancer     Past Medical History  Diagnosis Date  . Ovarian cyst    Past Surgical History  Procedure Laterality Date  . Ovarian cyst surgery    . Abdominal hysterectomy      partial   No family history on file. History  Substance Use Topics  . Smoking status: Current Every Day Smoker -- 2.00 packs/day    Types: Cigarettes  . Smokeless tobacco: Not on file  . Alcohol Use: No   OB History   Grav Para Term Preterm Abortions TAB SAB Ect Mult Living                 Review of Systems  Constitutional: Positive for fever.  Cardiovascular: Negative for chest pain.  All other systems reviewed and are negative.     Allergies  Review of patient's allergies indicates no known allergies.  Home Medications   Prior to Admission medications   Medication Sig Start Date End Date Taking? Authorizing Provider  Acetaminophen (TYLENOL PO) Take by mouth.    Historical Provider, MD  chlorpheniramine-HYDROcodone (TUSSIONEX PENNKINETIC ER) 10-8 MG/5ML LQCR Take 5 mLs by mouth every 12 (twelve) hours as needed. 02/17/13   Gavin Pound. Ghim, MD  levofloxacin (LEVAQUIN) 250 MG tablet Take 1 tablet (250 mg total) by mouth daily. 03/12/14   Shari A Upstill, PA-C  methocarbamol (ROBAXIN) 500 MG tablet Take 1 tablet (500 mg total) by mouth 2  (two) times daily. 09/21/12   April K Palumbo-Rasch, MD  metroNIDAZOLE (METROGEL VAGINAL) 0.75 % vaginal gel Place 1 Applicatorful vaginally 2 (two) times daily. 03/12/14   Shari A Upstill, PA-C  naproxen (NAPROSYN) 375 MG tablet Take 1 tablet (375 mg total) by mouth 2 (two) times daily. 09/21/12   April K Palumbo-Rasch, MD  naproxen (NAPROSYN) 500 MG tablet Take 1 tablet (500 mg total) by mouth 2 (two) times daily. 06/01/13   John-Adam Bonk, MD  phenazopyridine (PYRIDIUM) 200 MG tablet Take 1 tablet (200 mg total) by mouth 2 (two) times daily as needed for pain. 12/15/12   Geoffery Lyons, MD  promethazine (PHENERGAN) 25 MG tablet Take 1 tablet (25 mg total) by mouth every 6 (six) hours as needed for nausea. 02/17/13   Gavin Pound. Ghim, MD  sulfamethoxazole-trimethoprim (SEPTRA DS) 800-160 MG per tablet Take 1 tablet by mouth 2 (two) times daily. 12/15/12   Geoffery Lyons, MD   BP 109/73  Pulse 88  Temp(Src) 98.5 F (36.9 C) (Oral)  Resp 18  Ht 5\' 1"  (1.549 m)  Wt 160 lb (72.576 kg)  BMI 30.25 kg/m2  SpO2 95% Physical Exam  Nursing note and vitals reviewed. Constitutional: She is oriented to person, place, and time. She appears well-developed and well-nourished.  HENT:  Head: Normocephalic and atraumatic.  Eyes: Conjunctivae and EOM are normal. Pupils are equal, round, and reactive  to light.  Neck: Normal range of motion.  Cardiovascular: Normal rate.   Pulmonary/Chest: Effort normal.  Abdominal: Soft. She exhibits no distension.  Musculoskeletal: Normal range of motion.  Neurological: She is alert and oriented to person, place, and time.  Skin: Skin is warm.  Psychiatric: She has a normal mood and affect.    ED Course  Procedures (including critical care time) Labs Review Labs Reviewed  URINALYSIS, ROUTINE W REFLEX MICROSCOPIC - Abnormal; Notable for the following:    APPearance CLOUDY (*)    All other components within normal limits    Imaging Review Dg Chest 2 View  07/09/2014    CLINICAL DATA:  Fever and cough.  Chills.  EXAM: CHEST  2 VIEW  COMPARISON:  01/15/2014  FINDINGS: Heart size is normal. Mediastinal shadows are normal. The lungs are clear. No infiltrate, collapse or effusion. No bony abnormality.  IMPRESSION: Normal chest   Electronically Signed   By: Paulina FusiMark  Shogry M.D.   On: 07/09/2014 16:03     EKG Interpretation None      MDM   Final diagnoses:  None   Albuterol inhaler Zithromax Return if any problems.   Lonia SkinnerLeslie K GoshenSofia, PA-C 07/09/14 [phone]

## 2014-07-09 NOTE — ED Provider Notes (Signed)
Medical screening examination/treatment/procedure(s) were performed by non-physician practitioner and as supervising physician I was immediately available for consultation/collaboration.   EKG Interpretation None        Chrisie Jankovich T Shakiara Lukic, MD 07/09/14 1728 

## 2014-07-09 NOTE — ED Notes (Signed)
Fever, chills, body aches, dry cough and headache since last night.

## 2014-07-09 NOTE — Discharge Instructions (Signed)
Bronchitis  Bronchitis is inflammation of the airways that extend from the windpipe into the lungs (bronchi). The inflammation often causes mucus to develop, which leads to a cough. If the inflammation becomes severe, it may cause shortness of breath.  CAUSES   Bronchitis may be caused by:    Viral infections.    Bacteria.    Cigarette smoke.    Allergens, pollutants, and other irritants.   SIGNS AND SYMPTOMS   The most common symptom of bronchitis is a frequent cough that produces mucus. Other symptoms include:   Fever.    Body aches.    Chest congestion.    Chills.    Shortness of breath.    Sore throat.   DIAGNOSIS   Bronchitis is usually diagnosed through a medical history and physical exam. Tests, such as chest X-rays, are sometimes done to rule out other conditions.   TREATMENT   You may need to avoid contact with whatever caused the problem (smoking, for example). Medicines are sometimes needed. These may include:   Antibiotics. These may be prescribed if the condition is caused by bacteria.   Cough suppressants. These may be prescribed for relief of cough symptoms.    Inhaled medicines. These may be prescribed to help open your airways and make it easier for you to breathe.    Steroid medicines. These may be prescribed for those with recurrent (chronic) bronchitis.  HOME CARE INSTRUCTIONS   Get plenty of rest.    Drink enough fluids to keep your urine clear or pale yellow (unless you have a medical condition that requires fluid restriction). Increasing fluids may help thin your secretions and will prevent dehydration.    Only take over-the-counter or prescription medicines as directed by your health care provider.   Only take antibiotics as directed. Make sure you finish them even if you start to feel better.   Avoid secondhand smoke, irritating chemicals, and strong fumes. These will make bronchitis worse. If you are a smoker, quit smoking. Consider using nicotine gum or  skin patches to help control withdrawal symptoms. Quitting smoking will help your lungs heal faster.    Put a cool-mist humidifier in your bedroom at night to moisten the air. This may help loosen mucus. Change the water in the humidifier daily. You can also run the hot water in your shower and sit in the bathroom with the door closed for 5-10 minutes.    Follow up with your health care provider as directed.    Wash your hands frequently to avoid catching bronchitis again or spreading an infection to others.   SEEK MEDICAL CARE IF:  Your symptoms do not improve after 1 week of treatment.   SEEK IMMEDIATE MEDICAL CARE IF:   Your fever increases.   You have chills.    You have chest pain.    You have worsening shortness of breath.    You have bloody sputum.   You faint.   You have lightheadedness.   You have a severe headache.    You vomit repeatedly.  MAKE SURE YOU:    Understand these instructions.   Will watch your condition.   Will get help right away if you are not doing well or get worse.  Document Released: 12/11/2005 Document Revised: 10/01/2013 Document Reviewed: 08/05/2013  ExitCare Patient Information 2015 [employer]. This information is not intended to replace advice given to you by your health care provider. Make sure you discuss any questions you have with your health care   provider.

## 2014-08-17 ENCOUNTER — Encounter (HOSPITAL_BASED_OUTPATIENT_CLINIC_OR_DEPARTMENT_OTHER): Payer: Self-pay | Admitting: Emergency Medicine

## 2014-08-17 ENCOUNTER — Emergency Department (HOSPITAL_BASED_OUTPATIENT_CLINIC_OR_DEPARTMENT_OTHER)
Admission: EM | Admit: 2014-08-17 | Discharge: 2014-08-17 | Disposition: A | Discharge status: Home / Self Care | Payer: BC Managed Care – PPO | Attending: Emergency Medicine | Admitting: Emergency Medicine

## 2014-08-17 ENCOUNTER — Emergency Department (HOSPITAL_BASED_OUTPATIENT_CLINIC_OR_DEPARTMENT_OTHER): Payer: BC Managed Care – PPO

## 2014-08-17 DIAGNOSIS — Z792 Long term (current) use of antibiotics: Secondary | ICD-10-CM | POA: Diagnosis not present

## 2014-08-17 DIAGNOSIS — Z8742 Personal history of other diseases of the female genital tract: Secondary | ICD-10-CM | POA: Insufficient documentation

## 2014-08-17 DIAGNOSIS — M719 Bursopathy, unspecified: Secondary | ICD-10-CM

## 2014-08-17 DIAGNOSIS — M79609 Pain in unspecified limb: Secondary | ICD-10-CM | POA: Diagnosis present

## 2014-08-17 DIAGNOSIS — Z79899 Other long term (current) drug therapy: Secondary | ICD-10-CM | POA: Insufficient documentation

## 2014-08-17 DIAGNOSIS — M7150 Other bursitis, not elsewhere classified, unspecified site: Secondary | ICD-10-CM | POA: Diagnosis not present

## 2014-08-17 DIAGNOSIS — F17200 Nicotine dependence, unspecified, uncomplicated: Secondary | ICD-10-CM | POA: Diagnosis not present

## 2014-08-17 DIAGNOSIS — Z791 Long term (current) use of non-steroidal anti-inflammatories (NSAID): Secondary | ICD-10-CM | POA: Diagnosis not present

## 2014-08-17 MED ORDER — HYDROCODONE-ACETAMINOPHEN 5-325 MG PO TABS: 1.0000 | ORAL_TABLET | Freq: Four times a day (QID) | ORAL | Status: DC | PRN

## 2014-08-17 MED ORDER — PREDNISONE 10 MG PO TABS: ORAL_TABLET | ORAL | Status: DC

## 2014-08-17 NOTE — Discharge Instructions (Signed)
Bursitis °Bursitis is a swelling and soreness (inflammation) of a fluid-filled sac (bursa) that overlies and protects a joint. It can be caused by injury, overuse of the joint, arthritis or infection. The joints most likely to be affected are the elbows, shoulders, hips and knees. °HOME CARE INSTRUCTIONS  °· Apply ice to the affected area for 15-20 minutes each hour while awake for 2 days. Put the ice in a plastic bag and place a towel between the bag of ice and your skin. °· Rest the injured joint as much as possible, but continue to put the joint through a full range of motion, 4 times per day. (The shoulder joint especially becomes rapidly "frozen" if not used.) When the pain lessens, begin normal slow movements and usual activities. °· Only take over-the-counter or prescription medicines for pain, discomfort or fever as directed by your caregiver. °· Your caregiver may recommend draining the bursa and injecting medicine into the bursa. This may help the healing process. °· Follow all instructions for follow-up with your caregiver. This includes any orthopedic referrals, physical therapy and rehabilitation. Any delay in obtaining necessary care could result in a delay or failure of the bursitis to heal and chronic pain. °SEEK IMMEDIATE MEDICAL CARE IF:  °· Your pain increases even during treatment. °· You develop an oral temperature above 102° F (38.9° C) and have heat and inflammation over the involved bursa. °MAKE SURE YOU:  °· Understand these instructions. °· Will watch your condition. °· Will get help right away if you are not doing well or get worse. °Document Released: 12/08/2000 Document Revised: 03/04/2012 Document Reviewed: 03/02/2014 °Exit[employer]® Patient Information ©2015 [employer]. This information is not intended to replace advice given to you by your health care provider. Make sure you discuss any questions you have with your health care provider. ° °

## 2014-08-17 NOTE — ED Provider Notes (Signed)
CSN: [id number]     Arrival date & time 08/17/14  1442 History   First MD Initiated Contact with Patient 08/17/14 1454     Chief Complaint  Patient presents with  . Leg Pain     (Consider location/radiation/quality/duration/timing/severity/associated sxs/prior Treatment) HPI Comments: Pt states that for the last week she has been having pain in her right leg. Denies injury or fever. Pt states that she had no previous history of problems with the area. Pain originates in her hip. Denies decreased rom. States that she has been taking ibuprofen and tylenol without much relief. Denies swelling or redness to the area  The history is provided by the patient. No language interpreter was used.    Past Medical History  Diagnosis Date  . Ovarian cyst    Past Surgical History  Procedure Laterality Date  . Ovarian cyst surgery    . Abdominal hysterectomy      partial   No family history on file. History  Substance Use Topics  . Smoking status: Current Every Day Smoker -- 2.00 packs/day    Types: Cigarettes  . Smokeless tobacco: Not on file  . Alcohol Use: No   OB History   Grav Para Term Preterm Abortions TAB SAB Ect Mult Living                 Review of Systems  Constitutional: Negative.   Respiratory: Negative.   Cardiovascular: Negative.       Allergies  Review of patient's allergies indicates no known allergies.  Home Medications   Prior to Admission medications   Medication Sig Start Date End Date Taking? Authorizing Provider  Acetaminophen (TYLENOL PO) Take by mouth.    Historical Provider, MD  albuterol (PROVENTIL HFA;VENTOLIN HFA) 108 (90 BASE) MCG/ACT inhaler Inhale 1-2 puffs into the lungs every 6 (six) hours as needed for wheezing or shortness of breath. 07/09/14   Elson Areas, PA-C  azithromycin (ZITHROMAX Z-PAK) 250 MG tablet 2 tablets 1st day then 1 a day 07/09/14   Elson Areas, PA-C  chlorpheniramine-HYDROcodone [facility]) 10-8 MG/5ML  LQCR Take 5 mLs by mouth every 12 (twelve) hours as needed. 02/17/13   Gavin Pound. Ghim, MD  levofloxacin (LEVAQUIN) 250 MG tablet Take 1 tablet (250 mg total) by mouth daily. 03/12/14   Shari A Upstill, PA-C  methocarbamol (ROBAXIN) 500 MG tablet Take 1 tablet (500 mg total) by mouth 2 (two) times daily. 09/21/12   April K Palumbo-Rasch, MD  metroNIDAZOLE (METROGEL VAGINAL) 0.75 % vaginal gel Place 1 Applicatorful vaginally 2 (two) times daily. 03/12/14   Shari A Upstill, PA-C  naproxen (NAPROSYN) 375 MG tablet Take 1 tablet (375 mg total) by mouth 2 (two) times daily. 09/21/12   April K Palumbo-Rasch, MD  naproxen (NAPROSYN) 500 MG tablet Take 1 tablet (500 mg total) by mouth 2 (two) times daily. 06/01/13   John-Adam Bonk, MD  phenazopyridine (PYRIDIUM) 200 MG tablet Take 1 tablet (200 mg total) by mouth 2 (two) times daily as needed for pain. 12/15/12   Geoffery Lyons, MD  promethazine (PHENERGAN) 25 MG tablet Take 1 tablet (25 mg total) by mouth every 6 (six) hours as needed for nausea. 02/17/13   Gavin Pound. Ghim, MD  sulfamethoxazole-trimethoprim (SEPTRA DS) 800-160 MG per tablet Take 1 tablet by mouth 2 (two) times daily. 12/15/12   Geoffery Lyons, MD   BP 120/65  Pulse 105  Temp(Src) 98.3 F (36.8 C) (Oral)  Resp 18  Ht  (  1.549 m)  Wt 170 lb (77.111 kg)  BMI 32.14 kg/m2  SpO2 98% Physical Exam  Nursing note and vitals reviewed. Constitutional: She is oriented to person, place, and time. She appears well-developed and well-nourished.  Cardiovascular: Normal rate and regular rhythm.   Pulmonary/Chest: Effort normal and breath sounds normal.  Musculoskeletal:  Tender on the right lateral hip. Pt has full rom of hip and knee. No swelling or redness or the knee  Neurological: She is alert and oriented to person, place, and time. She exhibits normal muscle tone. Coordination normal.  Skin: Skin is warm and dry.    ED Course  Procedures (including critical care time) Labs Review Labs  Reviewed - No data to display  Imaging Review No results found.   EKG Interpretation None      MDM   Final diagnoses:  Bursitis   Doubt any concern for septic joint. Full rom. Will treat for bursitis    Teressa Lower, NP 08/17/14 [phone]

## 2014-08-17 NOTE — ED Notes (Signed)
Patient transported to X-ray 

## 2014-08-17 NOTE — ED Notes (Signed)
Right hip down entire leg pain x 1 week-denies injury

## 2014-08-18 NOTE — ED Provider Notes (Signed)
Medical screening examination/treatment/procedure(s) were performed by non-physician practitioner and as supervising physician I was immediately available for consultation/collaboration.     Aprile Dickenson, MD 08/18/14 0858 

## 2014-11-13 ENCOUNTER — Emergency Department (HOSPITAL_BASED_OUTPATIENT_CLINIC_OR_DEPARTMENT_OTHER): Payer: BC Managed Care – PPO

## 2014-11-13 ENCOUNTER — Emergency Department (HOSPITAL_BASED_OUTPATIENT_CLINIC_OR_DEPARTMENT_OTHER)
Admission: EM | Admit: 2014-11-13 | Discharge: 2014-11-13 | Disposition: A | Discharge status: Home / Self Care | Payer: BC Managed Care – PPO | Attending: Emergency Medicine | Admitting: Emergency Medicine

## 2014-11-13 ENCOUNTER — Encounter (HOSPITAL_BASED_OUTPATIENT_CLINIC_OR_DEPARTMENT_OTHER): Payer: Self-pay | Admitting: *Deleted

## 2014-11-13 DIAGNOSIS — Z79899 Other long term (current) drug therapy: Secondary | ICD-10-CM | POA: Insufficient documentation

## 2014-11-13 DIAGNOSIS — R0789 Other chest pain: Secondary | ICD-10-CM | POA: Diagnosis not present

## 2014-11-13 DIAGNOSIS — Z792 Long term (current) use of antibiotics: Secondary | ICD-10-CM | POA: Diagnosis not present

## 2014-11-13 DIAGNOSIS — Z8742 Personal history of other diseases of the female genital tract: Secondary | ICD-10-CM | POA: Insufficient documentation

## 2014-11-13 DIAGNOSIS — R079 Chest pain, unspecified: Secondary | ICD-10-CM | POA: Diagnosis present

## 2014-11-13 DIAGNOSIS — Z791 Long term (current) use of non-steroidal anti-inflammatories (NSAID): Secondary | ICD-10-CM | POA: Diagnosis not present

## 2014-11-13 DIAGNOSIS — Z720 Tobacco use: Secondary | ICD-10-CM | POA: Insufficient documentation

## 2014-11-13 LAB — TROPONIN I: Troponin I: <0.3 ng/mL (ref <0.30)

## 2014-11-13 MED ORDER — NAPROXEN 500 MG PO TABS: 500.0000 mg | ORAL_TABLET | Freq: Two times a day (BID) | ORAL | Status: DC

## 2014-11-13 MED ORDER — IBUPROFEN 400 MG PO TABS
400.0000 mg | ORAL_TABLET | Freq: Once | ORAL | Status: AC
  Administered 2014-11-13: 400 mg via ORAL
  Filled 2014-11-13: qty 1

## 2014-11-13 NOTE — Discharge Instructions (Signed)

## 2014-11-13 NOTE — ED Provider Notes (Signed)
CSN: [id number]     Arrival date & time 11/13/14  1624 History  This chart was scribed for non-physician practitioner working with Hurman HornJohn M Bednar, MD by Elveria Risingimelie Horne, ED Scribe. This patient was seen in room MH12/MH12 and the patient's care was started at 7:03 PM.   Chief Complaint  Patient presents with  . Chest Pain   The history is provided by the patient. No language interpreter was used.   HPI Comments: Natasha Nolan is a 38 y.o. female who presents to the Emergency Department complaining of chest pain with sudden onset this morning. Patient reports development of her chest pain just after arriving to work today. 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 Patient describes throbbing left chest pain exacerbated with movement and deep inspiration; alleviation at rest. Patient denies shortness of breath with exertion, dizziness, hemoptysis, coughing or lightheadedness. Patient reports severe pain at it's worst; no number rating reported. Patient denies history of PE/DVTs, recent surgeries, or new medications. Patient reports family cardiac history in her mother: CHF. Patient is an admitted smoker; she reports occasional use.   Past Medical History  Diagnosis Date  . Ovarian cyst    Past Surgical History  Procedure Laterality Date  . Ovarian cyst surgery    . Abdominal hysterectomy      partial   No family history on file. History  Substance Use Topics  . Smoking status: Current Every Day Smoker -- 2.00 packs/day    Types: Cigarettes  . Smokeless tobacco: Not on file  . Alcohol Use: No   OB History    No data available     Review of Systems  Constitutional: Negative for fever and chills.  Respiratory: Positive for chest tightness. Negative for cough and shortness of breath.   Cardiovascular: Negative for palpitations and leg swelling.  Musculoskeletal: Negative for joint swelling.  Neurological:  Negative for weakness, light-headedness and numbness.      Allergies  Review of patient's allergies indicates no known allergies.  Home Medications   Prior to Admission medications   Medication Sig Start Date End Date Taking? Authorizing Provider  Acetaminophen (TYLENOL PO) Take by mouth.    Historical Provider, MD  albuterol (PROVENTIL HFA;VENTOLIN HFA) 108 (90 BASE) MCG/ACT inhaler Inhale 1-2 puffs into the lungs every 6 (six) hours as needed for wheezing or shortness of breath. 07/09/14   Elson AreasLeslie K Sofia, PA-C  azithromycin (ZITHROMAX Z-PAK) 250 MG tablet 2 tablets 1st day then 1 a day 07/09/14   Elson AreasLeslie K Sofia, PA-C  chlorpheniramine-HYDROcodone [facility](TUSSIONEX PENNKINETIC ER) 10-8 MG/5ML LQCR Take 5 mLs by mouth every 12 (twelve) hours as needed. 02/17/13   Gavin PoundMichael Y. Ghim, MD  HYDROcodone-acetaminophen (NORCO/VICODIN) 5-325 MG per tablet Take 1 tablet by mouth every 6 (six) hours as needed. 08/17/14   Teressa LowerVrinda Pickering, NP  levofloxacin (LEVAQUIN) 250 MG tablet Take 1 tablet (250 mg total) by mouth daily. 03/12/14   Shari A Upstill, PA-C  methocarbamol (ROBAXIN) 500 MG tablet Take 1 tablet (500 mg total) by mouth 2 (two) times daily. 09/21/12   April K Palumbo-Rasch, MD  metroNIDAZOLE (METROGEL VAGINAL) 0.75 % vaginal gel Place 1 Applicatorful vaginally 2 (two) times daily. 03/12/14   Shari A Upstill, PA-C  naproxen (NAPROSYN) 375 MG tablet Take 1 tablet (375 mg total) by mouth 2 (two) times daily. 09/21/12   April K Palumbo-Rasch, MD  naproxen (NAPROSYN) 500 MG tablet Take 1  tablet (500 mg total) by mouth 2 (two) times daily. 06/01/13   John-Adam Bonk, MD  phenazopyridine (PYRIDIUM) 200 MG tablet Take 1 tablet (200 mg total) by mouth 2 (two) times daily as needed for pain. 12/15/12   Geoffery Lyonsouglas Delo, MD  predniSONE (DELTASONE) 10 MG tablet 6 day stepdown dose 08/17/14   Teressa LowerVrinda Pickering, NP  promethazine (PHENERGAN) 25 MG tablet Take 1 tablet (25 mg total) by mouth every 6 (six) hours as needed for nausea.  02/17/13   Gavin PoundMichael Y. Ghim, MD  sulfamethoxazole-trimethoprim (SEPTRA DS) 800-160 MG per tablet Take 1 tablet by mouth 2 (two) times daily. 12/15/12   Geoffery Lyonsouglas Delo, MD   Triage Vitals: BP 107/73 mmHg  Pulse 75  Temp(Src) 98.5 F (36.9 C) (Oral)  Resp 18  Ht 5' (1.524 m)  Wt 176 lb (79.833 kg)  BMI 34.37 kg/m2  SpO2 99% Physical Exam  Constitutional: She is oriented to person, place, and time. She appears well-developed and well-nourished. No distress.  HENT:  Head: Normocephalic and atraumatic.  Eyes: EOM are normal.  Neck: Neck supple. No tracheal deviation present.  Cardiovascular: Normal rate.   Pulmonary/Chest: Effort normal. No respiratory distress. She exhibits tenderness.  Left anterior chest wall tender to palpation no crepitus, no step offs.   Musculoskeletal: Normal range of motion.  Neurological: She is alert and oriented to person, place, and time.  Skin: Skin is warm and dry.  Psychiatric: She has a normal mood and affect. Her behavior is normal.  Nursing note and vitals reviewed.   ED Course  Procedures (including critical care time)\  COORDINATION OF CARE: 7:03 PM- Plans to obtain CXR. Discussed treatment plan with patient at bedside and patient agreed to plan.   8:48 PM Patient here with reproducible chest wall pain, likely to be muscular skeletal strain. Chest x-ray unremarkable, troponin is normal, HEART score of less than 2 and patient is PERC negative.  Will prescribe NSAIDs, rice therapy, return precautions discussed.  Labs Review Labs Reviewed  TROPONIN I    Imaging Review Dg Chest 2 View  11/13/2014   CLINICAL DATA:  Sudden onset chest pain this morning.  EXAM: CHEST  2 VIEW  COMPARISON:  07/09/2014  FINDINGS: The heart size and mediastinal contours are within normal limits. Both lungs are clear. The visualized skeletal structures are unremarkable.  IMPRESSION: No active cardiopulmonary disease.   Electronically Signed   By: Elberta Fortisaniel  Boyle M.D.   On:  11/13/2014 19:20     EKG Interpretation None      Date: 11/13/2014  Rate:65  Rhythm: normal sinus rhythm  QRS Axis: normal  Intervals: normal  ST/T Wave abnormalities: normal  Conduction Disutrbances: none  Narrative Interpretation:   Old EKG Reviewed: No significant changes noted     MDM   Final diagnoses:  Acute chest wall pain    BP 112/69 mmHg  Pulse 67  Temp(Src) 97.9 F (36.6 C) (Oral)  Resp 16  Ht 5' (1.524 m)  Wt 176 lb (79.833 kg)  BMI 34.37 kg/m2  SpO2 100%   I personally performed the services described in this documentation, which was scribed in my presence. The recorded information has been reviewed and is accurate.    Fayrene HelperBowie Jackalyn Haith, PA-C 11/13/14 2049  Fayrene HelperBowie Bryanna Yim, PA-C 11/13/14 2051  Hurman HornJohn M Bednar, MD 11/19/14 Earle [id number]

## 2014-11-13 NOTE — ED Notes (Signed)
EKG done on Pt. Arrival to ED.   Pt. In no distress and no shortness of breath noted   Pt. Has no c/o nausea or vomiting and no c/o arm pain or numbness.

## 2014-12-25 ENCOUNTER — Encounter (HOSPITAL_BASED_OUTPATIENT_CLINIC_OR_DEPARTMENT_OTHER): Payer: Self-pay | Admitting: *Deleted

## 2014-12-25 ENCOUNTER — Emergency Department (HOSPITAL_BASED_OUTPATIENT_CLINIC_OR_DEPARTMENT_OTHER)
Admission: EM | Admit: 2014-12-25 | Discharge: 2014-12-26 | Disposition: A | Discharge status: Home / Self Care | Payer: BC Managed Care – PPO | Attending: Emergency Medicine | Admitting: Emergency Medicine

## 2014-12-25 DIAGNOSIS — Z720 Tobacco use: Secondary | ICD-10-CM | POA: Diagnosis not present

## 2014-12-25 DIAGNOSIS — J209 Acute bronchitis, unspecified: Secondary | ICD-10-CM | POA: Diagnosis not present

## 2014-12-25 DIAGNOSIS — Z792 Long term (current) use of antibiotics: Secondary | ICD-10-CM | POA: Diagnosis not present

## 2014-12-25 DIAGNOSIS — Z8742 Personal history of other diseases of the female genital tract: Secondary | ICD-10-CM | POA: Diagnosis not present

## 2014-12-25 DIAGNOSIS — Z791 Long term (current) use of non-steroidal anti-inflammatories (NSAID): Secondary | ICD-10-CM | POA: Insufficient documentation

## 2014-12-25 DIAGNOSIS — R05 Cough: Secondary | ICD-10-CM | POA: Diagnosis present

## 2014-12-25 DIAGNOSIS — M791 Myalgia: Secondary | ICD-10-CM | POA: Insufficient documentation

## 2014-12-25 DIAGNOSIS — Z79899 Other long term (current) drug therapy: Secondary | ICD-10-CM | POA: Diagnosis not present

## 2014-12-25 DIAGNOSIS — J40 Bronchitis, not specified as acute or chronic: Secondary | ICD-10-CM

## 2014-12-25 MED ORDER — NAPROXEN 375 MG PO TABS: 375.0000 mg | ORAL_TABLET | Freq: Two times a day (BID) | ORAL | Status: DC

## 2014-12-25 MED ORDER — AZITHROMYCIN 250 MG PO TABS: ORAL_TABLET | ORAL | Status: DC

## 2014-12-25 MED ORDER — BENZONATATE 100 MG PO CAPS
100.0000 mg | ORAL_CAPSULE | Freq: Once | ORAL | Status: AC
  Administered 2014-12-25: 100 mg via ORAL
  Filled 2014-12-25: qty 1

## 2014-12-25 MED ORDER — KETOROLAC TROMETHAMINE 60 MG/2ML IM SOLN
60.0000 mg | Freq: Once | INTRAMUSCULAR | Status: AC
  Administered 2014-12-25: 60 mg via INTRAMUSCULAR
  Filled 2014-12-25: qty 2

## 2014-12-25 MED ORDER — ACETAMINOPHEN 325 MG PO TABS
650.0000 mg | ORAL_TABLET | Freq: Once | ORAL | Status: AC
  Administered 2014-12-25: 650 mg via ORAL
  Filled 2014-12-25: qty 2

## 2014-12-25 NOTE — ED Provider Notes (Signed)
CSN: [id number]     Arrival date & time 12/25/14  2113 History  This chart was scribed for Anira Sen[place] Smitty Cords, MD by Bronson Curb, ED Scribe. This patient was seen in room MH07/MH07 and the patient's care was started at 11:04 PM.   Chief Complaint  Patient presents with  . URI    Patient is a 39 y.o. female presenting with URI. The history is provided by the patient. No language interpreter was used.  URI Presenting symptoms: congestion, cough, fever and rhinorrhea   Presenting symptoms: no ear pain, no facial pain and no fatigue   Severity:  Moderate Duration:  5 days Timing:  Constant Progression:  Unchanged Chronicity:  New Relieved by:  Nothing Worsened by:  Nothing tried Ineffective treatments:  OTC medications Associated symptoms: myalgias   Associated symptoms: no headaches, no sneezing and no wheezing   Risk factors: not elderly and no recent travel      HPI Comments: Natasha Nolan is a 39 y.o. female who presents to the Emergency Department complaining of possible URI that began 5 days ago. There is associated fever, cough, congestion, and myalgias. Patient has tried Mucinex, Robitussin, Tylenol and Motrin without relief. She denies sore throat or SOB.    Past Medical History  Diagnosis Date  . Ovarian cyst    Past Surgical History  Procedure Laterality Date  . Ovarian cyst surgery    . Abdominal hysterectomy      partial   No family history on file. History  Substance Use Topics  . Smoking status: Current Some Day Smoker -- 2.00 packs/day    Types: Cigarettes  . Smokeless tobacco: Not on file  . Alcohol Use: No   OB History    No data available     Review of Systems  Constitutional: Positive for fever. Negative for fatigue.  HENT: Positive for congestion and rhinorrhea. Negative for dental problem, drooling, ear pain and sneezing.   Respiratory: Positive for cough. Negative for chest tightness, shortness of breath and wheezing.    Musculoskeletal: Positive for myalgias.  Neurological: Negative for headaches.  All other systems reviewed and are negative.     Allergies  Review of patient's allergies indicates no known allergies.  Home Medications   Prior to Admission medications   Medication Sig Start Date End Date Taking? Authorizing Provider  Acetaminophen (TYLENOL PO) Take by mouth.    Historical Provider, MD  albuterol (PROVENTIL HFA;VENTOLIN HFA) 108 (90 BASE) MCG/ACT inhaler Inhale 1-2 puffs into the lungs every 6 (six) hours as needed for wheezing or shortness of breath. 07/09/14   Elson Areas, PA-C  azithromycin (ZITHROMAX Z-PAK) 250 MG tablet 2 tablets 1st day then 1 a day 07/09/14   Elson Areas, PA-C  chlorpheniramine-HYDROcodone [facility]) 10-8 MG/5ML LQCR Take 5 mLs by mouth every 12 (twelve) hours as needed. 02/17/13   Gavin Pound. Ghim, MD  HYDROcodone-acetaminophen (NORCO/VICODIN) 5-325 MG per tablet Take 1 tablet by mouth every 6 (six) hours as needed. 08/17/14   Teressa Lower, NP  levofloxacin (LEVAQUIN) 250 MG tablet Take 1 tablet (250 mg total) by mouth daily. 03/12/14   Shari A Upstill, PA-C  methocarbamol (ROBAXIN) 500 MG tablet Take 1 tablet (500 mg total) by mouth 2 (two) times daily. 09/21/12   Maguire Sime K Maybelline Kolarik-Rasch, MD  metroNIDAZOLE (METROGEL VAGINAL) 0.75 % vaginal gel Place 1 Applicatorful vaginally 2 (two) times daily. 03/12/14   Shari A Upstill, PA-C  naproxen (NAPROSYN) 500 MG tablet Take 1 tablet (  500 mg total) by mouth 2 (two) times daily. 11/13/14   Fayrene Helper, PA-C  phenazopyridine (PYRIDIUM) 200 MG tablet Take 1 tablet (200 mg total) by mouth 2 (two) times daily as needed for pain. 12/15/12   Geoffery Lyons, MD  predniSONE (DELTASONE) 10 MG tablet 6 day stepdown dose 08/17/14   Teressa Lower, NP  promethazine (PHENERGAN) 25 MG tablet Take 1 tablet (25 mg total) by mouth every 6 (six) hours as needed for nausea. 02/17/13   Gavin Pound. Ghim, MD   sulfamethoxazole-trimethoprim (SEPTRA DS) 800-160 MG per tablet Take 1 tablet by mouth 2 (two) times daily. 12/15/12   Geoffery Lyons, MD   Triage Vitals: BP 125/75 mmHg  Pulse 85  Temp(Src) 101.6 F (38.7 C) (Oral)  Resp 20  Wt 165 lb (74.844 kg)  SpO2 99%  Physical Exam  Constitutional: She is oriented to person, place, and time. She appears well-developed and well-nourished. No distress.  HENT:  Head: Normocephalic and atraumatic.  Mouth/Throat: Oropharynx is clear and moist. No oropharyngeal exudate.  Eyes: Conjunctivae and EOM are normal. Pupils are equal, round, and reactive to light.  Neck: Normal range of motion. Neck supple. No tracheal deviation present.  Cardiovascular: Normal rate, regular rhythm and normal heart sounds.   Pulmonary/Chest: Effort normal and breath sounds normal. No respiratory distress. She has no wheezes. She has no rales. She exhibits no tenderness.  Abdominal: Soft. Bowel sounds are normal. There is no tenderness. There is no rebound and no guarding.  Musculoskeletal: Normal range of motion. She exhibits no edema or tenderness.  Lymphadenopathy:    She has no cervical adenopathy.  Neurological: She is alert and oriented to person, place, and time. She has normal reflexes.  Skin: Skin is warm and dry. She is not diaphoretic.  Psychiatric: She has a normal mood and affect. Her behavior is normal.  Nursing note and vitals reviewed.   ED Course  Procedures (including critical care time)  DIAGNOSTIC STUDIES: Oxygen Saturation is 99% on room air, normal by my interpretation.    COORDINATION OF CARE: 11:05 PM- Pt advised of plan for treatment and pt agrees.  Labs Review Labs Reviewed - No data to display  Imaging Review No results found.   EKG Interpretation None      MDM   Final diagnoses:  None    Given smoking history will treat for bronchitis.  Follow up for recheck with your PMD on Monday  I personally performed the services  described in this documentation, which was scribed in my presence. The recorded information has been reviewed and is accurate.     Jasmine Awe, MD 12/26/14 [phone]

## 2014-12-25 NOTE — ED Notes (Addendum)
C/o URI sx, lists cough, congestion, chills, body aches, fever. Onset Sunday. Taking mucinex w/o relief. Took tylenol at 1500. Alert, NAD, calm, no dyspnea noted. Nasal congestion noted.

## 2014-12-26 ENCOUNTER — Encounter (HOSPITAL_BASED_OUTPATIENT_CLINIC_OR_DEPARTMENT_OTHER): Payer: Self-pay | Admitting: Emergency Medicine

## 2015-03-23 ENCOUNTER — Encounter (HOSPITAL_BASED_OUTPATIENT_CLINIC_OR_DEPARTMENT_OTHER): Payer: Self-pay | Admitting: *Deleted

## 2015-03-23 ENCOUNTER — Emergency Department (HOSPITAL_BASED_OUTPATIENT_CLINIC_OR_DEPARTMENT_OTHER)
Admission: EM | Admit: 2015-03-23 | Discharge: 2015-03-23 | Disposition: A | Discharge status: Home / Self Care | Payer: Self-pay | Attending: Emergency Medicine | Admitting: Emergency Medicine

## 2015-03-23 DIAGNOSIS — R102 Pelvic and perineal pain: Secondary | ICD-10-CM

## 2015-03-23 DIAGNOSIS — B9689 Other specified bacterial agents as the cause of diseases classified elsewhere: Secondary | ICD-10-CM

## 2015-03-23 DIAGNOSIS — Z720 Tobacco use: Secondary | ICD-10-CM | POA: Insufficient documentation

## 2015-03-23 DIAGNOSIS — Z791 Long term (current) use of non-steroidal anti-inflammatories (NSAID): Secondary | ICD-10-CM | POA: Insufficient documentation

## 2015-03-23 DIAGNOSIS — Z792 Long term (current) use of antibiotics: Secondary | ICD-10-CM | POA: Insufficient documentation

## 2015-03-23 DIAGNOSIS — N760 Acute vaginitis: Secondary | ICD-10-CM | POA: Insufficient documentation

## 2015-03-23 DIAGNOSIS — Z3202 Encounter for pregnancy test, result negative: Secondary | ICD-10-CM | POA: Insufficient documentation

## 2015-03-23 DIAGNOSIS — Z79899 Other long term (current) drug therapy: Secondary | ICD-10-CM | POA: Insufficient documentation

## 2015-03-23 LAB — WET PREP, GENITAL
Trich, Wet Prep: NONE SEEN
Yeast Wet Prep HPF POC: NONE SEEN

## 2015-03-23 LAB — URINALYSIS, ROUTINE W REFLEX MICROSCOPIC
Bilirubin Urine: NEGATIVE
Glucose, UA: NEGATIVE mg/dL
Hgb urine dipstick: NEGATIVE
Ketones, ur: NEGATIVE mg/dL
LEUKOCYTES UA: NEGATIVE
Nitrite: NEGATIVE
PH: 7 (ref 5.0–8.0)
Protein, ur: NEGATIVE mg/dL
Specific Gravity, Urine: 1.016 (ref 1.005–1.030)
UROBILINOGEN UA: 1 mg/dL (ref 0.0–1.0)

## 2015-03-23 LAB — PREGNANCY, URINE: Preg Test, Ur: NEGATIVE

## 2015-03-23 MED ORDER — OXYCODONE-ACETAMINOPHEN 5-325 MG PO TABS
1.0000 | ORAL_TABLET | Freq: Once | ORAL | Status: AC
  Administered 2015-03-23: 1 via ORAL
  Filled 2015-03-23: qty 1

## 2015-03-23 MED ORDER — NAPROXEN 500 MG PO TABS: 500.0000 mg | ORAL_TABLET | Freq: Two times a day (BID) | ORAL | Status: DC

## 2015-03-23 MED ORDER — TRAMADOL HCL 50 MG PO TABS: 50.0000 mg | ORAL_TABLET | Freq: Four times a day (QID) | ORAL | Status: DC | PRN

## 2015-03-23 MED ORDER — METRONIDAZOLE 500 MG PO TABS: 500.0000 mg | ORAL_TABLET | Freq: Two times a day (BID) | ORAL | Status: DC

## 2015-03-23 MED ORDER — ONDANSETRON 4 MG PO TBDP
4.0000 mg | ORAL_TABLET | Freq: Once | ORAL | Status: AC
  Administered 2015-03-23: 4 mg via ORAL
  Filled 2015-03-23: qty 1

## 2015-03-23 NOTE — ED Notes (Signed)
Abdominal pain. Vagina discharge x 3 days.

## 2015-03-23 NOTE — ED Provider Notes (Signed)
CSN: [id number]     Arrival date & time 03/23/15  1807 History   First MD Initiated Contact with Patient 03/23/15 1815     Chief Complaint  Patient presents with  . Abdominal Pain     (Consider location/radiation/quality/duration/timing/severity/associated sxs/prior Treatment) HPI  Pt is a 39yo female with hx of ovarian cyst, presenting to ED with c/o gradually worsening Right lower abdominal and pelvic pain that started 3 days ago. Pain is aching and sharp, 8/10 at worse, waxes and wanes, feels similar to last time she had an ovarian cyst. Pt states last time she needed an antibiotic as there was infection found on the pelvic exam. Pt reports having a partial hysterectomy. Pt has also had a cyst removed. Denies f/u with her OB/GYN for over 1 year as she has not had any issues.  She has not tried any pain medication PTA. Denies fever, chills, n/v/d. States she has had mild dysuria but no frequency, urgency or hematuria.   Past Medical History  Diagnosis Date  . Ovarian cyst    Past Surgical History  Procedure Laterality Date  . Ovarian cyst surgery    . Abdominal hysterectomy      partial   No family history on file. History  Substance Use Topics  . Smoking status: Current Some Day Smoker -- 2.00 packs/day    Types: Cigarettes  . Smokeless tobacco: Not on file  . Alcohol Use: No   OB History    No data available     Review of Systems  Constitutional: Negative for fever and chills.  Respiratory: Negative for cough and shortness of breath.   Gastrointestinal: Negative for nausea, vomiting, abdominal pain and diarrhea.  Genitourinary: Positive for dysuria, vaginal discharge and pelvic pain (right side). Negative for urgency, frequency, hematuria, flank pain, decreased urine volume, vaginal bleeding, vaginal pain and menstrual problem.  Musculoskeletal: Negative for myalgias and back pain.  All other systems reviewed and are negative.     Allergies  Review of patient's  allergies indicates no known allergies.  Home Medications   Prior to Admission medications   Medication Sig Start Date End Date Taking? Authorizing Provider  Acetaminophen (TYLENOL PO) Take by mouth.    Historical Provider, MD  albuterol (PROVENTIL HFA;VENTOLIN HFA) 108 (90 BASE) MCG/ACT inhaler Inhale 1-2 puffs into the lungs every 6 (six) hours as needed for wheezing or shortness of breath. 07/09/14   Elson Areas, PA-C  azithromycin (ZITHROMAX Z-PAK) 250 MG tablet 2 tablets 1st day then 1 a day 07/09/14   Elson Areas, PA-C  azithromycin (ZITHROMAX Z-PAK) 250 MG tablet 2 po day one, then 1 daily x 4 days 12/25/14   April Palumbo, MD  chlorpheniramine-HYDROcodone [facility]) 10-8 MG/5ML [facility] Take 5 mLs by mouth every 12 (twelve) hours as needed. 02/17/13   Quita Skye, MD  HYDROcodone-acetaminophen (NORCO/VICODIN) 5-325 MG per tablet Take 1 tablet by mouth every 6 (six) hours as needed. 08/17/14   Teressa Lower, NP  levofloxacin (LEVAQUIN) 250 MG tablet Take 1 tablet (250 mg total) by mouth daily. 03/12/14   Elpidio Anis, PA-C  methocarbamol (ROBAXIN) 500 MG tablet Take 1 tablet (500 mg total) by mouth 2 (two) times daily. 09/21/12   April Palumbo, MD  metroNIDAZOLE (FLAGYL) 500 MG tablet Take 1 tablet (500 mg total) by mouth 2 (two) times daily. One po bid x 7 days 03/23/15   Junius Finner, PA-C  metroNIDAZOLE (METROGEL VAGINAL) 0.75 % vaginal gel Place 1 Applicatorful vaginally  2 (two) times daily. 03/12/14   Elpidio Anis, PA-C  naproxen (NAPROSYN) 375 MG tablet Take 1 tablet (375 mg total) by mouth 2 (two) times daily. 12/25/14   April Palumbo, MD  naproxen (NAPROSYN) 500 MG tablet Take 1 tablet (500 mg total) by mouth 2 (two) times daily. 11/13/14   Fayrene Helper, PA-C  naproxen (NAPROSYN) 500 MG tablet Take 1 tablet (500 mg total) by mouth 2 (two) times daily. 03/23/15   Junius Finner, PA-C  phenazopyridine (PYRIDIUM) 200 MG tablet Take 1 tablet (200 mg total) by mouth 2 (two)  times daily as needed for pain. 12/15/12   Geoffery Lyons, MD  predniSONE (DELTASONE) 10 MG tablet 6 day stepdown dose 08/17/14   Teressa Lower, NP  promethazine (PHENERGAN) 25 MG tablet Take 1 tablet (25 mg total) by mouth every 6 (six) hours as needed for nausea. 02/17/13   Quita Skye, MD  sulfamethoxazole-trimethoprim (SEPTRA DS) 800-160 MG per tablet Take 1 tablet by mouth 2 (two) times daily. 12/15/12   Geoffery Lyons, MD  traMADol (ULTRAM) 50 MG tablet Take 1 tablet (50 mg total) by mouth every 6 (six) hours as needed. 03/23/15   Junius Finner, PA-C   BP 114/70 mmHg  Pulse 70  Temp(Src) 98.1 F (36.7 C) (Oral)  Resp 18  Ht  (1.549 m)  Wt 165 lb (74.844 kg)  BMI 31.19 kg/m2  SpO2 100% Physical Exam  Constitutional: She appears well-developed and well-nourished. No distress.  HENT:  Head: Normocephalic and atraumatic.  Eyes: Conjunctivae are normal. No scleral icterus.  Neck: Normal range of motion.  Cardiovascular: Normal rate, regular rhythm and normal heart sounds.   Pulmonary/Chest: Effort normal and breath sounds normal. No respiratory distress. She has no wheezes. She has no rales. She exhibits no tenderness.  Abdominal: Soft. Bowel sounds are normal. She exhibits no distension and no mass. There is no tenderness. There is no rebound and no guarding.  Genitourinary:  Chaperoned exam. Normal external genitalia. Vaginal canal: small amount of white-clear discharge. No bleeding. No CMT, Right adnexal tenderness w/o mass. No left adnexal tenderness.  Musculoskeletal: Normal range of motion.  Neurological: She is alert.  Skin: Skin is warm and dry. She is not diaphoretic.  Nursing note and vitals reviewed.   ED Course  Procedures (including critical care time) Labs Review Labs Reviewed  WET PREP, GENITAL - Abnormal; Notable for the following:    Clue Cells Wet Prep HPF POC MANY (*)    WBC, Wet Prep HPF POC MANY (*)    All other components within normal limits   URINALYSIS, ROUTINE W REFLEX MICROSCOPIC  PREGNANCY, URINE  HIV ANTIBODY (ROUTINE TESTING)  RPR  GC/CHLAMYDIA PROBE AMP ([place])    Imaging Review No results found.   EKG Interpretation None      MDM   Final diagnoses:  Acute pelvic pain, female  Bacterial vaginosis    Pt is a 39yo female with hx of ovarian cysts presenting to ED with c/o Right lower pelvic pain similar to previous cyst. Pain worsening over 3 days.  Pt mildly tender to RLQ over Right adnexa on exam. No rebound or guarding. No adnexal mass. Pt appears well, non-toxic. Pt is afebrile.  Doubt ovarian torsion, TOA, or ectopic pregnancy.  UA: WNL. Wet prep: many clue cells, will tx for BV with flagyl. Home care instructions provided. Advised to f/u with OB/GYN in 3-4 days if not improving. Return precautions provided. Pt verbalized understanding and agreement with tx plan.  Junius Finnerrin O'Malley, PA-C 03/23/15 2014  Gwyneth SproutWhitney Plunkett, MD 03/23/15 [phone]

## 2015-03-24 LAB — GC/CHLAMYDIA PROBE AMP (~~LOC~~) NOT AT ARMC
Chlamydia: NEGATIVE
Neisseria Gonorrhea: NEGATIVE

## 2015-03-25 LAB — RPR: RPR Ser Ql: NONREACTIVE

## 2015-03-25 LAB — HIV ANTIBODY (ROUTINE TESTING W REFLEX): HIV Screen 4th Generation wRfx: NONREACTIVE

## 2015-04-12 ENCOUNTER — Encounter (HOSPITAL_BASED_OUTPATIENT_CLINIC_OR_DEPARTMENT_OTHER): Payer: Self-pay | Admitting: *Deleted

## 2015-04-12 ENCOUNTER — Emergency Department (HOSPITAL_BASED_OUTPATIENT_CLINIC_OR_DEPARTMENT_OTHER)
Admission: EM | Admit: 2015-04-12 | Discharge: 2015-04-12 | Disposition: A | Discharge status: Home / Self Care | Payer: Self-pay | Attending: Emergency Medicine | Admitting: Emergency Medicine

## 2015-04-12 DIAGNOSIS — R300 Dysuria: Secondary | ICD-10-CM | POA: Insufficient documentation

## 2015-04-12 DIAGNOSIS — Z792 Long term (current) use of antibiotics: Secondary | ICD-10-CM | POA: Insufficient documentation

## 2015-04-12 DIAGNOSIS — Z791 Long term (current) use of non-steroidal anti-inflammatories (NSAID): Secondary | ICD-10-CM | POA: Insufficient documentation

## 2015-04-12 DIAGNOSIS — Z8742 Personal history of other diseases of the female genital tract: Secondary | ICD-10-CM | POA: Insufficient documentation

## 2015-04-12 DIAGNOSIS — R350 Frequency of micturition: Secondary | ICD-10-CM | POA: Insufficient documentation

## 2015-04-12 DIAGNOSIS — Z79899 Other long term (current) drug therapy: Secondary | ICD-10-CM | POA: Insufficient documentation

## 2015-04-12 DIAGNOSIS — Z720 Tobacco use: Secondary | ICD-10-CM | POA: Insufficient documentation

## 2015-04-12 LAB — URINALYSIS, ROUTINE W REFLEX MICROSCOPIC
Bilirubin Urine: NEGATIVE
Glucose, UA: NEGATIVE mg/dL
HGB URINE DIPSTICK: NEGATIVE
KETONES UR: NEGATIVE mg/dL
Leukocytes, UA: NEGATIVE
Nitrite: NEGATIVE
PH: 7 (ref 5.0–8.0)
PROTEIN: NEGATIVE mg/dL
Specific Gravity, Urine: 1.006 (ref 1.005–1.030)
Urobilinogen, UA: 0.2 mg/dL (ref 0.0–1.0)

## 2015-04-12 MED ORDER — PHENAZOPYRIDINE HCL 200 MG PO TABS: 200.0000 mg | ORAL_TABLET | Freq: Two times a day (BID) | ORAL | Status: DC | PRN

## 2015-04-12 NOTE — ED Notes (Signed)
Rx x 1 for pyridium given at d/c

## 2015-04-12 NOTE — ED Notes (Signed)
Pt c/o freq painful urination x 1 day.  

## 2015-04-12 NOTE — ED Notes (Signed)
MD at bedside. 

## 2015-04-12 NOTE — Discharge Instructions (Signed)

## 2015-04-12 NOTE — ED Provider Notes (Signed)
CSN: [id number]     Arrival date & time 04/12/15  1819 History  This chart was scribed for Nelva Nayobert Luv Mish, MD by Gwenyth Oberatherine Macek, ED Scribe. This patient was seen in room MH05/MH05 and the patient's care was started at 7:12 PM.    Chief Complaint  Patient presents with  . Dysuria   The history is provided by the patient. No language interpreter was used.    HPI Comments: Natasha Nolan is a 39 y.o. female who presents to the Emergency Department complaining of intermittent, moderate dysuria that started yesterday. She states urinary frequency as an associated symptom. Pt was seen in the ED on 3/29 for RLQ and pelvic pain. She was diagnosed with bacterial vaginosis and prescribed Flagyl. Pt notes onset of current symptoms started after she finished treatment. She denies fever.  Past Medical History  Diagnosis Date  . Ovarian cyst    Past Surgical History  Procedure Laterality Date  . Ovarian cyst surgery    . Abdominal hysterectomy      partial   History reviewed. No pertinent family history. History  Substance Use Topics  . Smoking status: Current Some Day Smoker -- 0.50 packs/day    Types: Cigarettes  . Smokeless tobacco: Not on file  . Alcohol Use: No   OB History    No data available     Review of Systems  Constitutional: Negative for fever.  Genitourinary: Positive for dysuria and frequency.  All other systems reviewed and are negative.     Allergies  Review of patient's allergies indicates no known allergies.  Home Medications   Prior to Admission medications   Medication Sig Start Date End Date Taking? Authorizing Provider  Acetaminophen (TYLENOL PO) Take by mouth.    Historical Provider, MD  albuterol (PROVENTIL HFA;VENTOLIN HFA) 108 (90 BASE) MCG/ACT inhaler Inhale 1-2 puffs into the lungs every 6 (six) hours as needed for wheezing or shortness of breath. 07/09/14   Elson AreasLeslie K Sofia, PA-C  azithromycin (ZITHROMAX Z-PAK) 250 MG tablet 2 tablets 1st day then 1 a  day 07/09/14   Elson AreasLeslie K Sofia, PA-C  azithromycin (ZITHROMAX Z-PAK) 250 MG tablet 2 po day one, then 1 daily x 4 days 12/25/14   April Palumbo, MD  chlorpheniramine-HYDROcodone [facility](TUSSIONEX PENNKINETIC ER) 10[facility]8 [facility] Take 5 mLs by mouth every 12 (twelve) hours as needed. 02/17/13   Quita SkyeMichael Ghim, MD  HYDROcodone-acetaminophen (NORCO/VICODIN) 5-325 MG per tablet Take 1 tablet by mouth every 6 (six) hours as needed. 08/17/14   Teressa LowerVrinda Pickering, NP  levofloxacin (LEVAQUIN) 250 MG tablet Take 1 tablet (250 mg total) by mouth daily. 03/12/14   Elpidio AnisShari Upstill, PA-C  methocarbamol (ROBAXIN) 500 MG tablet Take 1 tablet (500 mg total) by mouth 2 (two) times daily. 09/21/12   April Palumbo, MD  metroNIDAZOLE (FLAGYL) 500 MG tablet Take 1 tablet (500 mg total) by mouth 2 (two) times daily. One po bid x 7 days 03/23/15   Junius FinnerErin O'Malley, PA-C  metroNIDAZOLE (METROGEL VAGINAL) 0.75 % vaginal gel Place 1 Applicatorful vaginally 2 (two) times daily. 03/12/14   Elpidio AnisShari Upstill, PA-C  naproxen (NAPROSYN) 375 MG tablet Take 1 tablet (375 mg total) by mouth 2 (two) times daily. 12/25/14   April Palumbo, MD  naproxen (NAPROSYN) 500 MG tablet Take 1 tablet (500 mg total) by mouth 2 (two) times daily. 11/13/14   Fayrene HelperBowie Tran, PA-C  naproxen (NAPROSYN) 500 MG tablet Take 1 tablet (500 mg total) by mouth 2 (two) times daily. 03/23/15   Denny PeonErin  Gershon Mussel, PA-C  phenazopyridine (PYRIDIUM) 200 MG tablet Take 1 tablet (200 mg total) by mouth 2 (two) times daily as needed for pain. 04/12/15   Nelva Nay, MD  predniSONE (DELTASONE) 10 MG tablet 6 day stepdown dose 08/17/14   Teressa Lower, NP  promethazine (PHENERGAN) 25 MG tablet Take 1 tablet (25 mg total) by mouth every 6 (six) hours as needed for nausea. 02/17/13   Quita Skye, MD  sulfamethoxazole-trimethoprim (SEPTRA DS) 800-160 MG per tablet Take 1 tablet by mouth 2 (two) times daily. 12/15/12   Geoffery Lyons, MD  traMADol (ULTRAM) 50 MG tablet Take 1 tablet (50 mg total) by mouth every 6  (six) hours as needed. 03/23/15   Junius Finner, PA-C   BP 124/78 mmHg Physical Exam Physical Exam  Nursing note and vitals reviewed. Constitutional: She is oriented to person, place, and time. She appears well-developed and well-nourished. No distress.  HENT:  Head: Normocephalic and atraumatic.  Eyes: Pupils are equal, round, and reactive to light.  Neck: Normal range of motion.  Cardiovascular: Normal rate and intact distal pulses.   Pulmonary/Chest: No respiratory distress.  Abdominal: Normal appearance. She exhibits no distension.  No tenderness.  Active bowel sounds.  No rebound or guarding.   Musculoskeletal: Normal range of motion.  Neurological: She is alert and oriented to person, place, and time. No cranial nerve deficit.  Skin: Skin is warm and dry. No rash noted.  Psychiatric: She has a normal mood and affect. Her behavior is normal.   ED Course  Procedures   DIAGNOSTIC STUDIES: Oxygen Saturation is 100% on RA, normal by my interpretation.    COORDINATION OF CARE: 7:16 PM Discussed UA results and treatment plan with pt. She agreed to plan.   Labs Review Labs Reviewed  URINALYSIS, ROUTINE W REFLEX MICROSCOPIC      MDM   Final diagnoses:  Dysuria   I personally performed the services described in this documentation, which was scribed in my presence. The recorded information has been reviewed and considered.    Nelva Nay, MD 04/12/15 [phone]

## 2015-05-31 ENCOUNTER — Emergency Department (HOSPITAL_BASED_OUTPATIENT_CLINIC_OR_DEPARTMENT_OTHER)
Admission: EM | Admit: 2015-05-31 | Discharge: 2015-05-31 | Disposition: A | Discharge status: Home / Self Care | Payer: Self-pay | Attending: Emergency Medicine | Admitting: Emergency Medicine

## 2015-05-31 ENCOUNTER — Encounter (HOSPITAL_BASED_OUTPATIENT_CLINIC_OR_DEPARTMENT_OTHER): Payer: Self-pay | Admitting: *Deleted

## 2015-05-31 ENCOUNTER — Emergency Department (HOSPITAL_BASED_OUTPATIENT_CLINIC_OR_DEPARTMENT_OTHER): Payer: Self-pay

## 2015-05-31 DIAGNOSIS — Z8742 Personal history of other diseases of the female genital tract: Secondary | ICD-10-CM | POA: Insufficient documentation

## 2015-05-31 DIAGNOSIS — Z79899 Other long term (current) drug therapy: Secondary | ICD-10-CM | POA: Insufficient documentation

## 2015-05-31 DIAGNOSIS — M79645 Pain in left finger(s): Secondary | ICD-10-CM

## 2015-05-31 DIAGNOSIS — M79642 Pain in left hand: Secondary | ICD-10-CM | POA: Insufficient documentation

## 2015-05-31 DIAGNOSIS — Z720 Tobacco use: Secondary | ICD-10-CM | POA: Insufficient documentation

## 2015-05-31 MED ORDER — NAPROXEN 500 MG PO TABS: 500.0000 mg | ORAL_TABLET | Freq: Two times a day (BID) | ORAL | Status: DC

## 2015-05-31 NOTE — Discharge Instructions (Signed)
Hand Contusion °A hand contusion is a deep bruise on your hand area. Contusions are the result of an injury that caused bleeding under the skin. The contusion may turn blue, purple, or yellow. Minor injuries will give you a painless contusion, but more severe contusions may stay painful and swollen for a few weeks. °CAUSES  °A contusion is usually caused by a blow, trauma, or direct force to an area of the body. °SYMPTOMS  °· Swelling and redness of the injured area. °· Discoloration of the injured area. °· Tenderness and soreness of the injured area. °· Pain. °DIAGNOSIS  °The diagnosis can be made by taking a history and performing a physical exam. An X-ray, CT scan, or MRI may be needed to determine if there were any associated injuries, such as broken bones (fractures). °TREATMENT  °Often, the best treatment for a hand contusion is resting, elevating, icing, and applying cold compresses to the injured area. Over-the-counter medicines may also be recommended for pain control. °HOME CARE INSTRUCTIONS  °· Put ice on the injured area. °¨ Put ice in a plastic bag. °¨ Place a towel between your skin and the bag. °¨ Leave the ice on for 15-20 minutes, 03-04 times a day. °· Only take over-the-counter or prescription medicines as directed by your caregiver. Your caregiver may recommend avoiding anti-inflammatory medicines (aspirin, ibuprofen, and naproxen) for 48 hours because these medicines may increase bruising. °· If told, use an elastic wrap as directed. This can help reduce swelling. You may remove the wrap for sleeping, showering, and bathing. If your fingers become numb, cold, or blue, take the wrap off and reapply it more loosely. °· Elevate your hand with pillows to reduce swelling. °· Avoid overusing your hand if it is painful. °SEEK IMMEDIATE MEDICAL CARE IF:  °· You have increased redness, swelling, or pain in your hand. °· Your swelling or pain is not relieved with medicines. °· You have loss of feeling in  your hand or are unable to move your fingers. °· Your hand turns cold or blue. °· You have pain when you move your fingers. °· Your hand becomes warm to the touch. °· Your contusion does not improve in 2 days. °MAKE SURE YOU:  °· Understand these instructions. °· Will watch your condition. °· Will get help right away if you are not doing well or get worse. °Document Released: 06/02/2002 Document Revised: 09/04/2012 Document Reviewed: 06/03/2012 °[employer]® Patient Information ©2015 [employer]. This information is not intended to replace advice given to you by your health care provider. Make sure you discuss any questions you have with your health care provider. ° °

## 2015-05-31 NOTE — ED Notes (Signed)
Pt c/o waking up this morning with left hand pain and sts she has no grip and can barely move her left index finger.

## 2015-06-01 NOTE — ED Provider Notes (Signed)
CSN: [id number]     Arrival date & time 05/31/15  1155 History   First MD Initiated Contact with Patient 05/31/15 1229     Chief Complaint  Patient presents with  . Hand Pain     (Consider location/radiation/quality/duration/timing/severity/associated sxs/prior Treatment) HPI Comments: Patient woke up this morning with pain in her left hand, specifically at her left finger and has pain with flexion of the left finger.  No numbness or weakness.  No known illness or injuries.  No fevers.  Patient is a 39 y.o. female presenting with hand pain.  Hand Pain Pertinent negatives include no chest pain, no abdominal pain, no headaches and no shortness of breath.    Past Medical History  Diagnosis Date  . Ovarian cyst    Past Surgical History  Procedure Laterality Date  . Ovarian cyst surgery    . Abdominal hysterectomy      partial   No family history on file. History  Substance Use Topics  . Smoking status: Current Some Day Smoker -- 0.50 packs/day    Types: Cigarettes  . Smokeless tobacco: Not on file  . Alcohol Use: No   OB History    No data available     Review of Systems  Constitutional: Negative for fever, chills, diaphoresis, activity change, appetite change and fatigue.  HENT: Negative for congestion, facial swelling, rhinorrhea and sore throat.   Eyes: Negative for photophobia and discharge.  Respiratory: Negative for cough, chest tightness and shortness of breath.   Cardiovascular: Negative for chest pain, palpitations and leg swelling.  Gastrointestinal: Negative for nausea, vomiting, abdominal pain and diarrhea.  Endocrine: Negative for polydipsia and polyuria.  Genitourinary: Negative for dysuria, frequency, difficulty urinating and pelvic pain.  Musculoskeletal: Negative for back pain, arthralgias, neck pain and neck stiffness.  Skin: Negative for color change and wound.  Allergic/Immunologic: Negative for immunocompromised state.  Neurological: Negative for  facial asymmetry, weakness, numbness and headaches.  Hematological: Does not bruise/bleed easily.  Psychiatric/Behavioral: Negative for confusion and agitation.      Allergies  Review of patient's allergies indicates no known allergies.  Home Medications   Prior to Admission medications   Medication Sig Start Date End Date Taking? Authorizing Provider  Acetaminophen (TYLENOL PO) Take by mouth.    Historical Provider, MD  albuterol (PROVENTIL HFA;VENTOLIN HFA) 108 (90 BASE) MCG/ACT inhaler Inhale 1-2 puffs into the lungs every 6 (six) hours as needed for wheezing or shortness of breath. 07/09/14   Elson Areas, PA-C  chlorpheniramine-HYDROcodone [facility] ER) 10-8 MG/5ML LQCR Take 5 mLs by mouth every 12 (twelve) hours as needed. 02/17/13   Quita Skye, MD  naproxen (NAPROSYN) 500 MG tablet Take 1 tablet (500 mg total) by mouth 2 (two) times daily with a meal. 05/31/15   Toy Cookey, MD   BP 110/73 mmHg  Pulse 76  Temp(Src) 98.4 F (36.9 C) (Oral)  Resp 18  Ht  (1.549 m)  Wt 165 lb (74.844 kg)  BMI 31.19 kg/m2  SpO2 100% Physical Exam  Constitutional: She is oriented to person, place, and time. She appears well-developed and well-nourished. No distress.  HENT:  Head: Normocephalic and atraumatic.  Mouth/Throat: No oropharyngeal exudate.  Eyes: Pupils are equal, round, and reactive to light.  Neck: Normal range of motion. Neck supple.  Cardiovascular: Normal rate, regular rhythm and normal heart sounds.  Exam reveals no gallop and no friction rub.   No murmur heard. Pulmonary/Chest: Effort normal and breath sounds normal. No respiratory  distress. She has no wheezes. She has no rales.  Abdominal: Soft. Bowel sounds are normal. She exhibits no distension and no mass. There is no tenderness. There is no rebound and no guarding.  Musculoskeletal: Normal range of motion. She exhibits no edema.       Left hand: She exhibits tenderness and bony tenderness.        Hands: Neurological: She is alert and oriented to person, place, and time.  Skin: Skin is warm and dry.  Psychiatric: She has a normal mood and affect.    ED Course  Procedures (including critical care time) Labs Review Labs Reviewed - No data to display  Imaging Review Dg Hand Complete Left  05/31/2015   CLINICAL DATA:  Woke this morning with pain. Swelling and index finger.  EXAM: LEFT HAND - COMPLETE 3+ VIEW  COMPARISON:  None.  FINDINGS: There is no evidence of fracture or dislocation. There is no evidence of arthropathy or other focal bone abnormality. Soft tissues are unremarkable.  IMPRESSION: Negative.   Electronically Signed   By: Signa Kellaylor  Stroud M.D.   On: 05/31/2015 12:46     EKG Interpretation None      MDM   Final diagnoses:  Pain in finger of left hand    Pt is a 39 y.o. female with Pmhx as above who presents with pain of the dorsal side of the left hand which is worse at the index finger and worse with flexion of the index finger.  She's had no known injuries.  She's no wheezes.  Skin changes are noted.  No appreciable swelling.  X-ray of the hand is negative.  Given that she woke up with this pain, I suspect she likely fell asleep on the hand at an awkward angle.  Patient will be placed in splint for comfort and asked to return should symptoms worsen.  Exam findings are not consistent with acute infection/ tenosynovitis.      Pincus BadderMargaret Nolan evaluation in the Emergency Department is complete. It has been determined that no acute conditions requiring further emergency intervention are present at this time. The patient/guardian have been advised of the diagnosis and plan. We have discussed signs and symptoms that warrant return to the ED, such as changes or worsening in symptoms, worsening pain, fever, redness, swelling.       Toy CookeyMegan Rena Sweeden, MD 06/02/15 1016

## 2015-09-02 ENCOUNTER — Encounter (HOSPITAL_BASED_OUTPATIENT_CLINIC_OR_DEPARTMENT_OTHER): Payer: Self-pay | Admitting: Emergency Medicine

## 2015-09-02 ENCOUNTER — Emergency Department (HOSPITAL_BASED_OUTPATIENT_CLINIC_OR_DEPARTMENT_OTHER)
Admission: EM | Admit: 2015-09-02 | Discharge: 2015-09-02 | Disposition: A | Discharge status: Home / Self Care | Payer: Self-pay | Attending: Emergency Medicine | Admitting: Emergency Medicine

## 2015-09-02 DIAGNOSIS — Z720 Tobacco use: Secondary | ICD-10-CM | POA: Insufficient documentation

## 2015-09-02 DIAGNOSIS — K2970 Gastritis, unspecified, without bleeding: Secondary | ICD-10-CM | POA: Insufficient documentation

## 2015-09-02 DIAGNOSIS — Z8742 Personal history of other diseases of the female genital tract: Secondary | ICD-10-CM | POA: Insufficient documentation

## 2015-09-02 LAB — COMPREHENSIVE METABOLIC PANEL
ALBUMIN: 3.7 g/dL (ref 3.5–5.0)
ALT: 10 U/L — AB (ref 14–54)
AST: 22 U/L (ref 15–41)
Alkaline Phosphatase: 47 U/L (ref 38–126)
Anion gap: 6 (ref 5–15)
BUN: 10 mg/dL (ref 6–20)
CHLORIDE: 106 mmol/L (ref 101–111)
CO2: 23 mmol/L (ref 22–32)
CREATININE: 0.86 mg/dL (ref 0.44–1.00)
Calcium: 8.9 mg/dL (ref 8.9–10.3)
GFR calc Af Amer: >60 mL/min (ref >60)
GFR calc non Af Amer: >60 mL/min (ref >60)
Glucose, Bld: 78 mg/dL (ref 65–99)
Potassium: 4 mmol/L (ref 3.5–5.1)
SODIUM: 135 mmol/L (ref 135–145)
Total Bilirubin: 0.6 mg/dL (ref 0.3–1.2)
Total Protein: 6.8 g/dL (ref 6.5–8.1)

## 2015-09-02 LAB — LIPASE, BLOOD: LIPASE: 18 U/L — AB (ref 22–51)

## 2015-09-02 LAB — CBC WITH DIFFERENTIAL/PLATELET
Basophils Absolute: 0 10*3/uL (ref 0.0–0.1)
Basophils Relative: 0 % (ref 0–1)
EOS ABS: 0 10*3/uL (ref 0.0–0.7)
EOS PCT: 0 % (ref 0–5)
HCT: 35.8 % — ABNORMAL LOW (ref 36.0–46.0)
HEMOGLOBIN: 11.7 g/dL — AB (ref 12.0–15.0)
LYMPHS ABS: 3.2 10*3/uL (ref 0.7–4.0)
Lymphocytes Relative: 57 % — ABNORMAL HIGH (ref 12–46)
MCH: 28 pg (ref 26.0–34.0)
MCHC: 32.7 g/dL (ref 30.0–36.0)
MCV: 85.6 fL (ref 78.0–100.0)
MONOS PCT: 8 % (ref 3–12)
Monocytes Absolute: 0.5 10*3/uL (ref 0.1–1.0)
NEUTROS PCT: 35 % — AB (ref 43–77)
Neutro Abs: 2 10*3/uL (ref 1.7–7.7)
Platelets: 229 10*3/uL (ref 150–400)
RBC: 4.18 MIL/uL (ref 3.87–5.11)
RDW: 13.2 % (ref 11.5–15.5)
WBC: 5.6 10*3/uL (ref 4.0–10.5)

## 2015-09-02 LAB — URINALYSIS, ROUTINE W REFLEX MICROSCOPIC
Bilirubin Urine: NEGATIVE
GLUCOSE, UA: NEGATIVE mg/dL
Hgb urine dipstick: NEGATIVE
KETONES UR: NEGATIVE mg/dL
Leukocytes, UA: NEGATIVE
Nitrite: NEGATIVE
PROTEIN: NEGATIVE mg/dL
Specific Gravity, Urine: 1.012 (ref 1.005–1.030)
UROBILINOGEN UA: 0.2 mg/dL (ref 0.0–1.0)
pH: 5.5 (ref 5.0–8.0)

## 2015-09-02 MED ORDER — SUCRALFATE 1 G PO TABS: 1.0000 g | ORAL_TABLET | Freq: Two times a day (BID) | ORAL | Status: DC

## 2015-09-02 MED ORDER — ONDANSETRON 4 MG PO TBDP
4.0000 mg | ORAL_TABLET | Freq: Once | ORAL | Status: AC
  Administered 2015-09-02: 4 mg via ORAL
  Filled 2015-09-02: qty 1

## 2015-09-02 MED ORDER — LIDOCAINE VISCOUS 2 % MT SOLN
15.0000 mL | Freq: Once | OROMUCOSAL | Status: AC
  Administered 2015-09-02: 15 mL via OROMUCOSAL
  Filled 2015-09-02: qty 15

## 2015-09-02 MED ORDER — GI COCKTAIL ~~LOC~~
30.0000 mL | Freq: Once | ORAL | Status: AC
  Administered 2015-09-02: 30 mL via ORAL
  Filled 2015-09-02: qty 30

## 2015-09-02 MED ORDER — OMEPRAZOLE 20 MG PO CPDR: 20.0000 mg | DELAYED_RELEASE_CAPSULE | Freq: Every day | ORAL | Status: DC

## 2015-09-02 NOTE — ED Provider Notes (Signed)
CSN: [id number]     Arrival date & time 09/02/15  1004 History   First MD Initiated Contact with Patient 09/02/15 1008     Chief Complaint  Patient presents with  . Abdominal Pain     (Consider location/radiation/quality/duration/timing/severity/associated sxs/prior Treatment) Patient is a 39 y.o. female presenting with abdominal pain. The history is provided by the patient.  Abdominal Pain Pain location:  Epigastric Pain quality: sharp   Pain radiates to:  Does not radiate Pain severity:  Moderate Onset quality:  Gradual Duration:  3 days Timing:  Constant Progression:  Unchanged Context: not alcohol use and not retching   Relieved by:  Nothing Worsened by:  Nothing tried Ineffective treatments:  None tried Associated symptoms: nausea   Associated symptoms: no fever and no vomiting     Past Medical History  Diagnosis Date  . Ovarian cyst    Past Surgical History  Procedure Laterality Date  . Ovarian cyst surgery    . Abdominal hysterectomy      partial   History reviewed. No pertinent family history. Social History  Substance Use Topics  . Smoking status: Current Some Day Smoker -- 0.50 packs/day    Types: Cigarettes  . Smokeless tobacco: None  . Alcohol Use: No   OB History    No data available     Review of Systems  Constitutional: Negative for fever.  Gastrointestinal: Positive for nausea and abdominal pain. Negative for vomiting.  All other systems reviewed and are negative.     Allergies  Review of patient's allergies indicates no known allergies.  Home Medications   Prior to Admission medications   Medication Sig Start Date End Date Taking? Authorizing Provider  omeprazole (PRILOSEC) 20 MG capsule Take 1 capsule (20 mg total) by mouth daily. 09/02/15   Lyndal Pulley, MD  sucralfate (CARAFATE) 1 G tablet Take 1 tablet (1 g total) by mouth 2 (two) times daily. 09/02/15   Lyndal Pulley, MD   BP 113/77 mmHg  Pulse 65  Temp(Src) 98.6 F (37 C) (Oral)   Resp 18  Ht 5\' 1"  (1.549 m)  Wt 170 lb (77.111 kg)  BMI 32.14 kg/m2  SpO2 100% Physical Exam  Constitutional: She is oriented to person, place, and time. She appears well-developed and well-nourished. No distress.  HENT:  Head: Normocephalic.  Eyes: Conjunctivae are normal.  Neck: Neck supple. No tracheal deviation present.  Cardiovascular: Normal rate, regular rhythm and normal heart sounds.   Pulmonary/Chest: Effort normal and breath sounds normal. No respiratory distress.  Abdominal: Soft. She exhibits no distension. There is tenderness (RUQ).  Neurological: She is alert and oriented to person, place, and time.  Skin: Skin is warm and dry.  Psychiatric: She has a normal mood and affect.    ED Course  Procedures (including critical care time) Labs Review Labs Reviewed  URINALYSIS, ROUTINE W REFLEX MICROSCOPIC (NOT AT [facility]) - Abnormal; Notable for the following:    APPearance CLOUDY (*)    All other components within normal limits  COMPREHENSIVE METABOLIC PANEL - Abnormal; Notable for the following:    ALT 10 (*)    All other components within normal limits  CBC WITH DIFFERENTIAL/PLATELET - Abnormal; Notable for the following:    Hemoglobin 11.7 (*)    HCT 35.8 (*)    Neutrophils Relative % 35 (*)    Lymphocytes Relative 57 (*)    All other components within normal limits  LIPASE, BLOOD - Abnormal; Notable for the following:  Lipase 18 (*)    All other components within normal limits    Imaging Review No results found. I have personally reviewed and evaluated these images and lab results as part of my medical decision-making.   EKG Interpretation None      MDM   Final diagnoses:  Gastritis    39 y.o. female presents with upper abdominal pain over last 3 days much worse after eating associated with nausea. Unable to identify gallbladder on bedside US, Pt states had "some surgery on my gallbladder when I was like 18". Suspect cholecystectomy. No LFT/lipase  elevation. More likely gastritis/PUD. Will treat with carafate and PPI. Plan to follow up with PCP as needed and return precautions discussed for worsening or new concerning symptoms.     Lyndal Pulley, MD 09/02/15 [phone]

## 2015-09-02 NOTE — ED Notes (Signed)
Pt reports abdominal pain x 3 days, states that pain increases with food when eating, nausea, denies vomiting or diarrhea

## 2015-09-02 NOTE — Discharge Instructions (Signed)

## 2015-09-22 ENCOUNTER — Emergency Department (HOSPITAL_BASED_OUTPATIENT_CLINIC_OR_DEPARTMENT_OTHER)
Admission: EM | Admit: 2015-09-22 | Discharge: 2015-09-22 | Disposition: A | Discharge status: Home / Self Care | Payer: Self-pay | Attending: Emergency Medicine | Admitting: Emergency Medicine

## 2015-09-22 ENCOUNTER — Encounter (HOSPITAL_BASED_OUTPATIENT_CLINIC_OR_DEPARTMENT_OTHER): Payer: Self-pay

## 2015-09-22 DIAGNOSIS — R1013 Epigastric pain: Secondary | ICD-10-CM | POA: Insufficient documentation

## 2015-09-22 DIAGNOSIS — Z8742 Personal history of other diseases of the female genital tract: Secondary | ICD-10-CM | POA: Insufficient documentation

## 2015-09-22 DIAGNOSIS — R110 Nausea: Secondary | ICD-10-CM | POA: Insufficient documentation

## 2015-09-22 DIAGNOSIS — Z720 Tobacco use: Secondary | ICD-10-CM | POA: Insufficient documentation

## 2015-09-22 DIAGNOSIS — M549 Dorsalgia, unspecified: Secondary | ICD-10-CM | POA: Insufficient documentation

## 2015-09-22 LAB — CBC WITH DIFFERENTIAL/PLATELET
BASOS ABS: 0 10*3/uL (ref 0.0–0.1)
BASOS PCT: 0 %
EOS ABS: 0 10*3/uL (ref 0.0–0.7)
Eosinophils Relative: 0 %
HEMATOCRIT: 34.5 % — AB (ref 36.0–46.0)
HEMOGLOBIN: 11.2 g/dL — AB (ref 12.0–15.0)
Lymphocytes Relative: 42 %
Lymphs Abs: 3 10*3/uL (ref 0.7–4.0)
MCH: 27.7 pg (ref 26.0–34.0)
MCHC: 32.5 g/dL (ref 30.0–36.0)
MCV: 85.2 fL (ref 78.0–100.0)
Monocytes Absolute: 0.3 10*3/uL (ref 0.1–1.0)
Monocytes Relative: 4 %
NEUTROS ABS: 3.9 10*3/uL (ref 1.7–7.7)
NEUTROS PCT: 54 %
Platelets: 238 10*3/uL (ref 150–400)
RBC: 4.05 MIL/uL (ref 3.87–5.11)
RDW: 13.2 % (ref 11.5–15.5)
WBC: 7.3 10*3/uL (ref 4.0–10.5)

## 2015-09-22 LAB — COMPREHENSIVE METABOLIC PANEL
ALBUMIN: 3.8 g/dL (ref 3.5–5.0)
ALK PHOS: 52 U/L (ref 38–126)
ALT: 12 U/L — ABNORMAL LOW (ref 14–54)
ANION GAP: 6 (ref 5–15)
AST: 20 U/L (ref 15–41)
BUN: 8 mg/dL (ref 6–20)
CALCIUM: 9.2 mg/dL (ref 8.9–10.3)
CO2: 28 mmol/L (ref 22–32)
Chloride: 105 mmol/L (ref 101–111)
Creatinine, Ser: 0.87 mg/dL (ref 0.44–1.00)
GFR calc Af Amer: >60 mL/min (ref >60)
GFR calc non Af Amer: >60 mL/min (ref >60)
GLUCOSE: 93 mg/dL (ref 65–99)
Potassium: 3.8 mmol/L (ref 3.5–5.1)
SODIUM: 139 mmol/L (ref 135–145)
Total Bilirubin: 0.2 mg/dL — ABNORMAL LOW (ref 0.3–1.2)
Total Protein: 6.9 g/dL (ref 6.5–8.1)

## 2015-09-22 LAB — URINALYSIS, ROUTINE W REFLEX MICROSCOPIC
BILIRUBIN URINE: NEGATIVE
Glucose, UA: NEGATIVE mg/dL
Hgb urine dipstick: NEGATIVE
KETONES UR: NEGATIVE mg/dL
LEUKOCYTES UA: NEGATIVE
NITRITE: NEGATIVE
PROTEIN: NEGATIVE mg/dL
Specific Gravity, Urine: 1.01 (ref 1.005–1.030)
UROBILINOGEN UA: 0.2 mg/dL (ref 0.0–1.0)
pH: 7 (ref 5.0–8.0)

## 2015-09-22 LAB — LIPASE, BLOOD: Lipase: 20 U/L — ABNORMAL LOW (ref 22–51)

## 2015-09-22 MED ORDER — GI COCKTAIL ~~LOC~~
30.0000 mL | Freq: Once | ORAL | Status: AC
  Administered 2015-09-22: 30 mL via ORAL
  Filled 2015-09-22: qty 30

## 2015-09-22 MED ORDER — ACETAMINOPHEN 500 MG PO TABS: 500.0000 mg | ORAL_TABLET | Freq: Four times a day (QID) | ORAL | Status: DC | PRN

## 2015-09-22 MED ORDER — METHOCARBAMOL 500 MG PO TABS: 500.0000 mg | ORAL_TABLET | Freq: Two times a day (BID) | ORAL | Status: DC

## 2015-09-22 MED ORDER — OMEPRAZOLE 20 MG PO CPDR: 20.0000 mg | DELAYED_RELEASE_CAPSULE | Freq: Every day | ORAL | Status: DC

## 2015-09-22 MED ORDER — ONDANSETRON 4 MG PO TBDP
4.0000 mg | ORAL_TABLET | Freq: Once | ORAL | Status: AC
  Administered 2015-09-22: 4 mg via ORAL
  Filled 2015-09-22: qty 1

## 2015-09-22 MED ORDER — SUCRALFATE 1 GM/10ML PO SUSP: 1.0000 g | Freq: Three times a day (TID) | ORAL | Status: DC

## 2015-09-22 NOTE — ED Provider Notes (Signed)
CSN: [id number]     Arrival date & time 09/22/15  1145 History   First MD Initiated Contact with Patient 09/22/15 1205     Chief Complaint  Patient presents with  . Abdominal Pain     HPI   Natasha Nolan is a 39 y.o. female with a PMH of ovarian cyst who presents to the ED with abdominal pain. She states her abdominal pain started in the beginning of September. She was seen in the ED on September 8, and was discharged with a PPI and carafate. She reports initially her symptoms resolved, but states she ran out of her prescribed medications and since that time, her symptoms have become progressively worse. She reports intermittent epigastric abdominal pain. She states eating exacerbates her pain. She has tried ibuprofen and tylenol for symptom relief, which have not been effective. She denies fever, chills, headache, dizziness, chest pain, shortness of breath. She reports nausea, but denies vomiting. She denies diarrhea, constipation. She denies dysuria, though reports urinary frequency. She denies vaginal discharge.  She also reports right sided low back pain that radiates to her right knee. She states this started yesterday. 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 She reports movement exacerbates her pain. She has tried ibuprofen and tylenol for symptom relief, which have been minimally effective. She denies numbness, paresthesia, weakness. She states that she has been ambulating without difficulty. She denies bowel or bladder incontinence, saddle anesthesia, history of malignancy, IV drug use, anticoagulant use.   Past Medical History  Diagnosis Date  . Ovarian cyst    Past Surgical History  Procedure Laterality Date  . Ovarian cyst surgery    . Abdominal hysterectomy      partial   No family history on file. Social History  Substance Use Topics  . Smoking status: Current Some Day Smoker -- 0.50 packs/day    Types: Cigarettes  . Smokeless  tobacco: None  . Alcohol Use: No   OB History    No data available      Review of Systems  Constitutional: Negative for fever, chills, activity change, appetite change and fatigue.  Respiratory: Negative for shortness of breath.   Cardiovascular: Negative for chest pain.  Gastrointestinal: Positive for nausea and abdominal pain. Negative for vomiting, diarrhea and constipation.  Genitourinary: Positive for frequency. Negative for dysuria and urgency.  Musculoskeletal: Positive for myalgias, back pain and arthralgias. Negative for neck pain and neck stiffness.  Neurological: Negative for dizziness, syncope, weakness, numbness and headaches.  All other systems reviewed and are negative.     Allergies  Review of patient's allergies indicates no known allergies.  Home Medications   Prior to Admission medications   Not on File    BP 113/72 mmHg  Pulse 70  Temp(Src) 98.7 F (37.1 C) (Oral)  Resp 18  Ht  (1.549 m)  Wt 170 lb (77.111 kg)  BMI 32.14 kg/m2  SpO2 100% Physical Exam  Constitutional: She is oriented to person, place, and time. She appears well-developed and well-nourished. No distress.  HENT:  Head: Normocephalic and atraumatic.  Right Ear: External ear normal.  Left Ear: External ear normal.  Nose: Nose normal.  Mouth/Throat: Uvula is midline, oropharynx is clear and moist and mucous membranes are normal.  Eyes: Conjunctivae, EOM and lids are normal. Pupils are equal, round, and reactive to light. Right eye exhibits no discharge. Left eye exhibits no discharge. No scleral icterus.  Neck:  Normal range of motion. Neck supple.  Cardiovascular: Normal rate, regular rhythm, normal heart sounds, intact distal pulses and normal pulses.   Pulmonary/Chest: Effort normal and breath sounds normal. No respiratory distress. She has no wheezes. She has no rales.  Abdominal: Soft. Normal appearance and bowel sounds are normal. She exhibits no distension and no mass.  There is tenderness. There is no rigidity, no rebound, no guarding and no CVA tenderness.  Mild tenderness to palpation of epigastrium. No rebound, guarding, or masses.  Musculoskeletal: Normal range of motion. She exhibits tenderness. She exhibits no edema.  Mild tenderness to palpation of right lumbar paraspinal muscles. No midline tenderness, palpable step-off, or deformity.  Neurological: She is alert and oriented to person, place, and time. She has normal strength and normal reflexes. No sensory deficit. Gait normal.  Patient ambulates without difficulty.  Skin: Skin is warm, dry and intact. No rash noted. She is not diaphoretic. No erythema. No pallor.  Psychiatric: She has a normal mood and affect. Her speech is normal and behavior is normal. Judgment and thought content normal.  Nursing note and vitals reviewed.   ED Course  Procedures (including critical care time)  Labs Review Labs Reviewed  URINALYSIS, ROUTINE W REFLEX MICROSCOPIC (NOT AT [facility]) - Abnormal; Notable for the following:    APPearance CLOUDY (*)    All other components within normal limits  CBC WITH DIFFERENTIAL/PLATELET - Abnormal; Notable for the following:    Hemoglobin 11.2 (*)    HCT 34.5 (*)    All other components within normal limits  COMPREHENSIVE METABOLIC PANEL - Abnormal; Notable for the following:    ALT 12 (*)    Total Bilirubin 0.2 (*)    All other components within normal limits  LIPASE, BLOOD - Abnormal; Notable for the following:    Lipase 20 (*)    All other components within normal limits    Imaging Review No results found.   I have personally reviewed and evaluated these lab results as part of my medical decision-making.   EKG Interpretation None      MDM   Final diagnoses:  Epigastric pain  Back pain, unspecified location    39 year old female presents with epigastric abdominal pain, which is been present since the beginning of September. She also reports right-sided low  back pain radiating to her right knee, which started yesterday. Denies fever, chills, headache, dizziness, chest pain, shortness of breath. Reports nausea. Denies vomiting, diarrhea, constipation. Denies dysuria, though reports urinary frequency. Denies vaginal discharge. Denies numbness, paresthesia, weakness, recent injury. States she has been ambulating without difficulty. Denies bowel or bladder incontinence, saddle anesthesia, history of malignancy, IV drug use, anticoagulant use.  Patient is afebrile with stable vital signs. She is a well-appearing and nontoxic. Heart regular rate and rhythm. Lungs clear to auscultation bilaterally. Abdomen soft, nondistended, with mild tenderness to palpation of epigastrium. No rebound, guarding, or masses. Mild tenderness to palpation of right lumbar paraspinal muscles. No midline tenderness, step-off, or deformity. Normal neuro exam with no focal deficit. Strength and sensation intact. DTRs intact. Patient ambulates without difficulty.  UA negative for infection. CBC with hemoglobin 11.2, appears stable. CMP, lipase unremarkable. Will give zofran for nausea and GI cocktail for epigastric abdominal pain and reassess.  Patient reports improvement in symptoms. She is tolerating PO intake. Will discharge with PPI and carafate, symptoms consistent with gastritis. Back pain most likely muscular. Low suspicion for hematoma, abscess, or cauda equina. Will treat with tylenol and muscle relaxant.  Patient to follow-up with GI and with PCP. Return precautions discussed. Patient in agreement with plan.  BP 122/60 mmHg  Pulse 65  Temp(Src) 98.7 F (37.1 C) (Oral)  Resp 17  Ht  (1.549 m)  Wt 170 lb (77.111 kg)  BMI 32.14 kg/m2  SpO2 100%       Mady Gemma, PA-C 09/22/15 1639  Gwyneth Sprout, MD 09/23/15 2157

## 2015-09-22 NOTE — Discharge Instructions (Signed)
1. Medications: omeprazole, sucralfate, tylenol, robaxin, usual home medications 2. Treatment: rest, drink plenty of fluids 3. Follow Up: please followup with gastroenterology and with your primary doctor for discussion of your diagnoses and further evaluation after today's visit; if you do not have a primary care doctor use the resource guide provided to find one; please return to the ER for high fever, worsening abdominal pain, persistent vomiting, difficulty walking, numbness in your groin, bowel or bladder incontinence   Abdominal Pain, Women Abdominal (stomach, pelvic, or belly) pain can be caused by many things. It is important to tell your doctor:  The location of the pain.  Does it come and go or is it present all the time?  Are there things that start the pain (eating certain foods, exercise)?  Are there other symptoms associated with the pain (fever, nausea, vomiting, diarrhea)? All of this is helpful to know when trying to find the cause of the pain. CAUSES   Stomach: virus or bacteria infection, or ulcer.  Intestine: appendicitis (inflamed appendix), regional ileitis (Crohn's disease), ulcerative colitis (inflamed colon), irritable bowel syndrome, diverticulitis (inflamed diverticulum of the colon), or cancer of the stomach or intestine.  Gallbladder disease or stones in the gallbladder.  Kidney disease, kidney stones, or infection.  Pancreas infection or cancer.  Fibromyalgia (pain disorder).  Diseases of the female organs:  Uterus: fibroid (non-cancerous) tumors or infection.  Fallopian tubes: infection or tubal pregnancy.  Ovary: cysts or tumors.  Pelvic adhesions (scar tissue).  Endometriosis (uterus lining tissue growing in the pelvis and on the pelvic organs).  Pelvic congestion syndrome (female organs filling up with blood just before the menstrual period).  Pain with the menstrual period.  Pain with ovulation (producing an egg).  Pain with an IUD  (intrauterine device, birth control) in the uterus.  Cancer of the female organs.  Functional pain (pain not caused by a disease, may improve without treatment).  Psychological pain.  Depression. DIAGNOSIS  Your doctor will decide the seriousness of your pain by doing an examination.  Blood tests.  X-rays.  Ultrasound.  CT scan (computed tomography, special type of X-ray).  MRI (magnetic resonance imaging).  Cultures, for infection.  Barium enema (dye inserted in the large intestine, to better view it with X-rays).  Colonoscopy (looking in intestine with a lighted tube).  Laparoscopy (minor surgery, looking in abdomen with a lighted tube).  Major abdominal exploratory surgery (looking in abdomen with a large incision). TREATMENT  The treatment will depend on the cause of the pain.   Many cases can be observed and treated at home.  Over-the-counter medicines recommended by your caregiver.  Prescription medicine.  Antibiotics, for infection.  Birth control pills, for painful periods or for ovulation pain.  Hormone treatment, for endometriosis.  Nerve blocking injections.  Physical therapy.  Antidepressants.  Counseling with a psychologist or psychiatrist.  Minor or major surgery. HOME CARE INSTRUCTIONS   Do not take laxatives, unless directed by your caregiver.  Take over-the-counter pain medicine only if ordered by your caregiver. Do not take aspirin because it can cause an upset stomach or bleeding.  Try a clear liquid diet (broth or water) as ordered by your caregiver. Slowly move to a bland diet, as tolerated, if the pain is related to the stomach or intestine.  Have a thermometer and take your temperature several times a day, and record it.  Bed rest and sleep, if it helps the pain.  Avoid sexual intercourse, if it causes pain.  Avoid stressful situations.  Keep your follow-up appointments and tests, as your caregiver orders.  If the pain  does not go away with medicine or surgery, you may try:  Acupuncture.  Relaxation exercises (yoga, meditation).  Group therapy.  Counseling. SEEK MEDICAL CARE IF:   You notice certain foods cause stomach pain.  Your home care treatment is not helping your pain.  You need stronger pain medicine.  You want your IUD removed.  You feel faint or lightheaded.  You develop nausea and vomiting.  You develop a rash.  You are having side effects or an allergy to your medicine. SEEK IMMEDIATE MEDICAL CARE IF:   Your pain does not go away or gets worse.  You have a fever.  Your pain is felt only in portions of the abdomen. The right side could possibly be appendicitis. The left lower portion of the abdomen could be colitis or diverticulitis.  You are passing blood in your stools (bright red or black tarry stools, with or without vomiting).  You have blood in your urine.  You develop chills, with or without a fever.  You pass out. MAKE SURE YOU:   Understand these instructions.  Will watch your condition.  Will get help right away if you are not doing well or get worse. Document Released: 10/08/2007 Document Revised: 04/27/2014 Document Reviewed: 10/28/2009 [facility] Patient Information 2015 [place], [place]. This information is not intended to replace advice given to you by your health care provider. Make sure you discuss any questions you have with your health care provider.  Back Exercises Back exercises help treat and prevent back injuries. The goal of back exercises is to increase the strength of your abdominal and back muscles and the flexibility of your back. These exercises should be started when you no longer have back pain. Back exercises include:  Pelvic Tilt. Lie on your back with your knees bent. Tilt your pelvis until the lower part of your back is against the floor. Hold this position 5 to 10 sec and repeat 5 to 10 times.  Knee to Chest. Pull first 1 knee up  against your chest and hold for 20 to 30 seconds, repeat this with the other knee, and then both knees. This may be done with the other leg straight or bent, whichever feels better.  Sit-Ups or Curl-Ups. Bend your knees 90 degrees. Start with tilting your pelvis, and do a partial, slow sit-up, lifting your trunk only 30 to 45 degrees off the floor. Take at least 2 to 3 seconds for each sit-up. Do not do sit-ups with your knees out straight. If partial sit-ups are difficult, simply do the above but with only tightening your abdominal muscles and holding it as directed.  Hip-Lift. Lie on your back with your knees flexed 90 degrees. Push down with your feet and shoulders as you raise your hips a couple inches off the floor; hold for 10 seconds, repeat 5 to 10 times.  Back arches. Lie on your stomach, propping yourself up on bent elbows. Slowly press on your hands, causing an arch in your low back. Repeat 3 to 5 times. Any initial stiffness and discomfort should lessen with repetition over time.  Shoulder-Lifts. Lie face down with arms beside your body. Keep hips and torso pressed to floor as you slowly lift your head and shoulders off the floor. Do not overdo your exercises, especially in the beginning. Exercises may cause you some mild back discomfort which lasts for a few minutes; however,  if the pain is more severe, or lasts for more than 15 minutes, do not continue exercises until you see your caregiver. Improvement with exercise therapy for back problems is slow.  See your caregivers for assistance with developing a proper back exercise program. Document Released: 01/18/2005 Document Revised: 03/04/2012 Document Reviewed: 10/12/2011 [facility] Patient Information 2015 [place], [place]. This information is not intended to replace advice given to you by your health care provider. Make sure you discuss any questions you have with your health care provider.  Back Pain, Adult Back pain is very common. The  pain often gets better over time. The cause of back pain is usually not dangerous. Most people can learn to manage their back pain on their own.  HOME CARE   Stay active. Start with short walks on flat ground if you can. Try to walk farther each day.  Do not sit, drive, or stand in one place for more than 30 minutes. Do not stay in bed.  Do not avoid exercise or work. Activity can help your back heal faster.  Be careful when you bend or lift an object. Bend at your knees, keep the object close to you, and do not twist.  Sleep on a firm mattress. Lie on your side, and bend your knees. If you lie on your back, put a pillow under your knees.  Only take medicines as told by your doctor.  Put ice on the injured area.  Put ice in a plastic bag.  Place a towel between your skin and the bag.  Leave the ice on for 15-20 minutes, 03-04 times a day for the first 2 to 3 days. After that, you can switch between ice and heat packs.  Ask your doctor about back exercises or massage.  Avoid feeling anxious or stressed. Find good ways to deal with stress, such as exercise. GET HELP RIGHT AWAY IF:   Your pain does not go away with rest or medicine.  Your pain does not go away in 1 week.  You have new problems.  You do not feel well.  The pain spreads into your legs.  You cannot control when you poop (bowel movement) or pee (urinate).  Your arms or legs feel weak or lose feeling (numbness).  You feel sick to your stomach (nauseous) or throw up (vomit).  You have belly (abdominal) pain.  You feel like you may pass out (faint). MAKE SURE YOU:   Understand these instructions.  Will watch your condition.  Will get help right away if you are not doing well or get worse. Document Released: 05/29/2008 Document Revised: 03/04/2012 Document Reviewed: 04/14/2014 [facility] Patient Information 2015 [place], [place]. This information is not intended to replace advice given to you by your health  care provider. Make sure you discuss any questions you have with your health care provider.   Emergency Department Resource Guide 1) Find a Doctor and Pay Out of Pocket Although you won't have to find out who is covered by your insurance plan, it is a good idea to ask around and get recommendations. You will then need to call the office and see if the doctor you have chosen will accept you as a new patient and what types of options they offer for patients who are self-pay. Some doctors offer discounts or will set up payment plans for their patients who do not have insurance, but you will need to ask so you aren't surprised when you get to your appointment.  2) Contact  Your Local Health Department Not all health departments have doctors that can see patients for sick visits, but many do, so it is worth a call to see if yours does. If you don't know where your local health department is, you can check in your phone book. The CDC also has a tool to help you locate your state's health department, and many state websites also have listings of all of their local health departments.  3) Find a Walk-in Clinic If your illness is not likely to be very severe or complicated, you may want to try a walk in clinic. These are popping up all over the country in pharmacies, drugstores, and shopping centers. They're usually staffed by nurse practitioners or physician assistants that have been trained to treat common illnesses and complaints. They're usually fairly quick and inexpensive. However, if you have serious medical issues or chronic medical problems, these are probably not your best option.  No Primary Care Doctor: - Call Health Connect at  [phone] - they can help you locate a primary care doctor that  accepts your insurance, provides certain services, etc. - Physician Referral Service- [phone]  Chronic Pain Problems: Organization         Address  Phone   Notes  [facility]   [phone] Patients need to be referred by their primary care doctor.   Medication Assistance: Organization         Address  Phone   Notes  [facility] Medication [facility] [address] [place], [place] [address] [phone] --Must be a resident of [facility] -- Must have NO insurance coverage whatsoever (no Medicaid/ Medicare, etc.) -- The pt. MUST have a primary care doctor that directs their care regularly and follows them in the community   Med[facility]ist  [phone]   [employer]  [phone]    Agencies that provide inexpensive medical care: Organization         Address  Phone   Notes  [facility]  [phone]   [facility]    [phone]   [facility] [address] [place], [place] [address] [phone]   [facility] [address] [place] [place] [phone]   Planned Parenthood    [phone]   [facility]    [phone]   [facility]  [address], [place] Phone:  [phone], Fax:  [phone] Hours of Operation:  9 am - 6 pm, M-F.  Also accepts Medicaid/Medicare and self-pay.  [facility]  [address], [place] Phone: [phone], Fax: [phone]. Hours of Operation:  8:30 am - 5:30 pm, M-F.  Also accepts Medicaid and self-pay.  [facility] [place] [address] [place] Phone: [phone]   [facility] [address] Natasha Bence [place], [place] [phone], Ext. [phone] Mondays & Thursdays: 7-9 AM.  First 15 patients are seen on a first come, first serve basis.    Medicaid-accepting [facility] Providers:  Organization         Address  Phone   Notes  [facility] [address],  [phone] Also accepts self-pay patients.  [facility] [address] Laurell Josephs [place],  [place]  [phone]   [facility] [address], [address] (  [phone]   [facility] [address], [place] [phone]   Renaye Rakers [address], [place]   [phone] Only accepts [place] Access [place] patients after they have their name applied to their card.   Self-Pay (no insurance) in [facility]:  Organization         Address  Phone   Notes  Sickle Cell Patients, [facility] [address] [place], [place] [phone]   [facility] [address] [place], [place] [phone]   [facility] [place]  [address], [place] [phone]   [facility]/Dr. Osei-Bonsu  [address], [place] or [address], [place] [phone] Phone number for both [place] and [place] locations is the same.  [facility] [address] [place] [phone]   [facility] [address] [place] or [address] [phone]   [facility] [address] [place] [phone], phone; [phone], fax Sees patients 1st and 3rd Saturday of every month.  Must not qualify for public or private insurance (i.e. Medicaid, Medicare, [place] Health Choice, Veterans' Benefits)  Household income should be no more than 200% of the poverty level The clinic cannot treat you if you are pregnant or think you are pregnant  Sexually transmitted diseases are not treated at the clinic.    Dental Care: Organization         Address  Phone  Notes  [facility] Department of [facility] [address] [place], [place] [phone] Accepts children up to age 2 who are enrolled in [place] or [place] Health Choice; pregnant women with a Medicaid card; and children who have applied for Medicaid or [place] Health  Choice, but were declined, whose parents can pay a reduced fee at time of service.  [facility] of [facility]  [address], [place] [phone] Accepts children up to age 67 who are enrolled in [place] or [place] Health Choice; pregnant women with a Medicaid card; and children who have applied for Medicaid or [place] Health Choice, but were declined, whose parents can pay a reduced fee at time of service.  [facility]  [address] [place], [place] [phone] Patients are seen by appointment only. Walk-ins are not accepted. [facility] will see patients 44 years of age and older. Monday - Tuesday (8am-5pm) Most Wednesdays (8:30-5pm) $30 per visit, cash only  [facility] Adult Dental Access PROGRAM  [address], [facility] [phone] Patients are seen by appointment only. Walk-ins are not accepted. [facility] will see patients 61 years of age and older. One Wednesday Evening (Monthly: Volunteer Based).  $30 per visit, cash only  [employer]  [phone] for adults; Children under age 68, call Graduate Pediatric Dentistry at [phone]. Children aged 39-14, please call [phone] to request a pediatric application.  Dental services are provided in all areas of dental care including fillings, crowns and bridges, complete and partial dentures, implants, gum treatment, root canals, and extractions. Preventive care is also provided. Treatment is provided to both adults and children. Patients are selected via a lottery and there is often a waiting list.   [facility] [address], [place]  [phone])  [phone] [email or web address]   [facility] [address] [place], [place] [phone], Ext. [phone] Second and Fourth Thursday of each month, opens at 6:30 AM; Clinic ends at 9 AM.  Patients are seen on a first-come first-served basis, and a limited number are seen during each  clinic.   [facility]  [address] Ether Griffins [place], [place] [phone]   Eligibility Requirements You must have lived in [place], [place], or [place] counties for at least the last three months.   You cannot be eligible for state or federal sponsored [place], including [employer], [place], or [employer].   You generally cannot be eligible for healthcare insurance through your employer.    How to apply: Eligibility screenings are held every Tuesday and Wednesday afternoon from 1:00 pm until 4:00 pm. You do not need an appointment for the interview!  [facility] [address] [place] [address] [place], [place] [phone]   [facility] Health Department  [phone]   [facility] Health Department  [phone]   [facility] Health Department  [phone]    Behavioral Health Resources in the Community: Intensive Outpatient Programs Organization         Address  Phone  Notes  [facility] [address], [place], [place] [phone]   [facility] [address] [place], [place] [phone]   [facility] [address], [place], [place]  [phone]   [facility] Mental Health [address]  [place], [place] [phone] or [phone]   Substance Abuse Resources Organization         Address  Phone  Notes  [facility]  [phone]   [facility]  [phone]   [facility] [place]  [phone]   Floydene Flock  [phone]   Residential & Outpatient Substance Abuse Program  [phone]   Psychological Services Organization         Address  Phone  Notes  [facility] Behavioral Health  [phone]   [facility]  [phone]   [facility] Mental Health [address], [place] [phone] or [phone]    Mobile Crisis Teams Organization         Address  Phone  Notes  [facility], Mobile  Crisis Care Unit  [phone]   [facility]  [address] [place], [place] [phone]   Doristine Locks [address] [place] [phone]    Self-Help/Support Groups Organization         Address  Phone             Notes  [facility] [place] - variety of support groups  [phone]- [phone] Call for more information  Narcotics Anonymous (NA), [facility] [address], [employer] [place]  2 meetings at this location   Statistician         Address  Phone  Notes  [facility] [address],    [place]  [phone]   [facility]  [address] [place] [address], [place], [place] [phone]   [facility] Facility [address] [place], [place] [phone] Admissions: 8am-3pm M-F  [facility] [address],    [place], [place] [phone]   [facility] [address] Starling Manns [place], [place] [phone]   [facility] [address],  [place], [place] [phone]   [facility] -  Intensive Outpatient [address] Laurell Josephs [address], [place], [place] [phone]   [facility] ([facility] [address] [place]  [place], [place] [phone] or [phone]   [facility] ([facility]) [address] [place], [place] [phone] Accepts Medicaid  [facility] [address],  [place] [phone] Substance Abuse/Addiction Treatment   [facility] Organization         Address  Phone  Notes  [facility]  [phone]   Angie Fava, PhD [address] Ervin Knack [place], [place]   [phone] or [phone]   [facility] Behavioral   [address] [place], [place] [phone]   [facility] [address], [place], [place] [phone] Insurance/Medicaid/sponsorship through [facility] and Families [address]                                    [place], [place] [phone] Therapy/tele-psych/case  [facility] [address], [place] [phone]    Dr. Lolly Mustache  [phone]   [facility]  [employer] [facility] Dept. 1) [address], [place] 2) [address] [place] 3)  [address], [place] [phone]  [phone]   [facility] Child Abuse Hotline (919)710-0487 or 6802743837 (After Hours)

## 2015-09-22 NOTE — ED Notes (Signed)
EDP at bedside  

## 2015-09-22 NOTE — ED Notes (Signed)
When reviewing daily meds, pt states "i don't know but i ain't taking nothing" when asked is he got rx x 2 filled from 09/02/15 visit here

## 2015-09-22 NOTE — ED Notes (Signed)
C/o abd pain and right hip pain-started yesterday-pt with steady gait into triage

## 2016-03-31 IMAGING — CR DG KNEE COMPLETE 4+V*R*
4 series · 4 of 4 positions shown · non-contrast
Comparison: 06/01/2013

CLINICAL DATA: Anterior RIGHT knee and RIGHT hip pain for several
weeks, no known injury

EXAM:
RIGHT KNEE - COMPLETE 4+ VIEW

[t knee ap right]
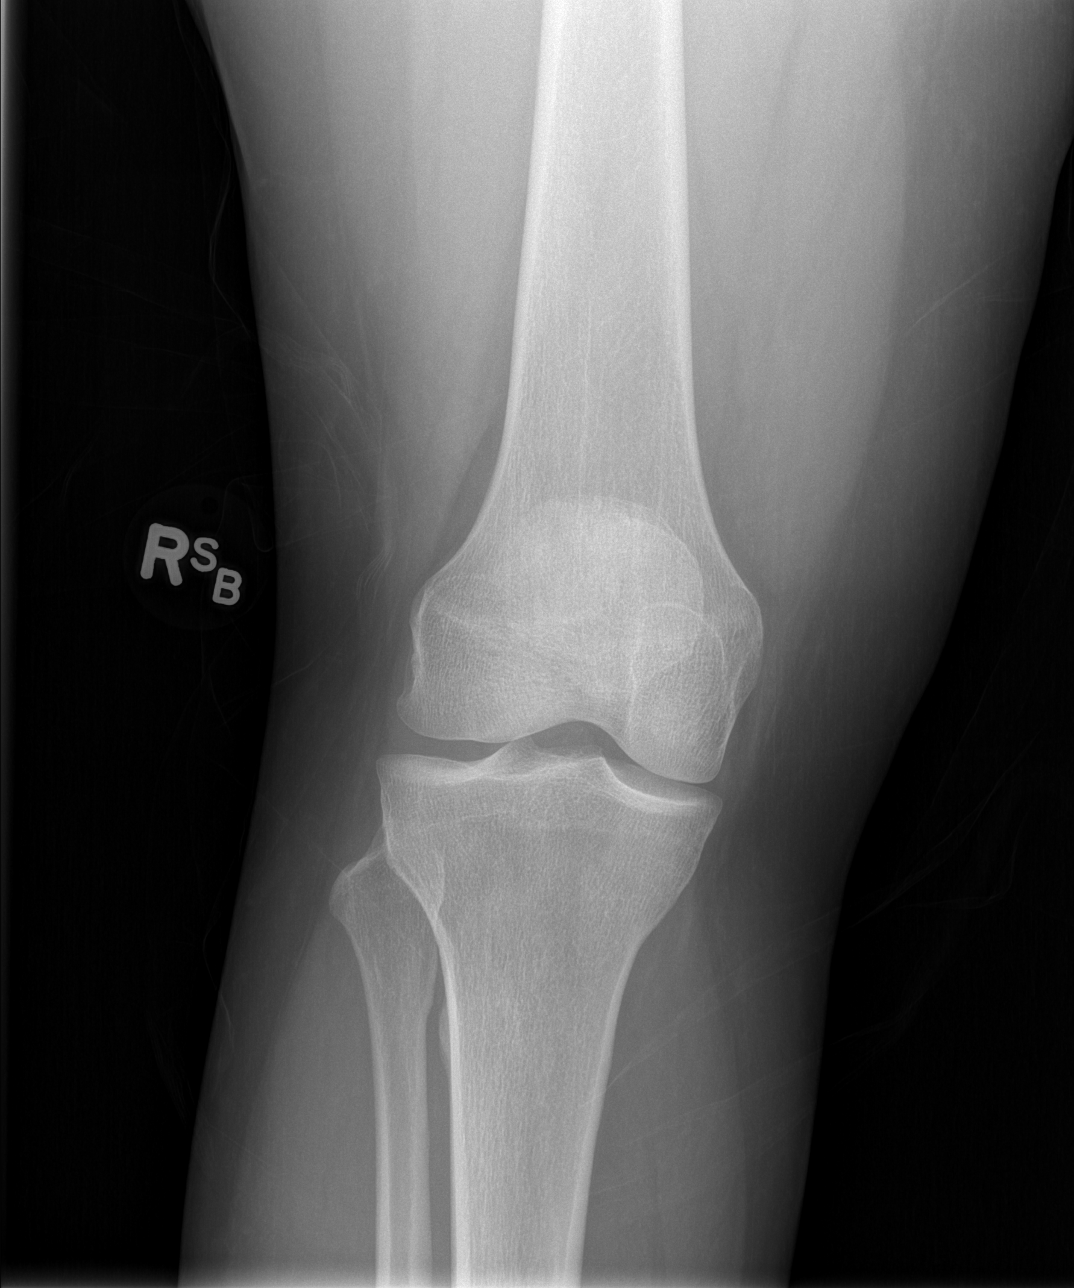

[t knee oblique right (1 of 2)]
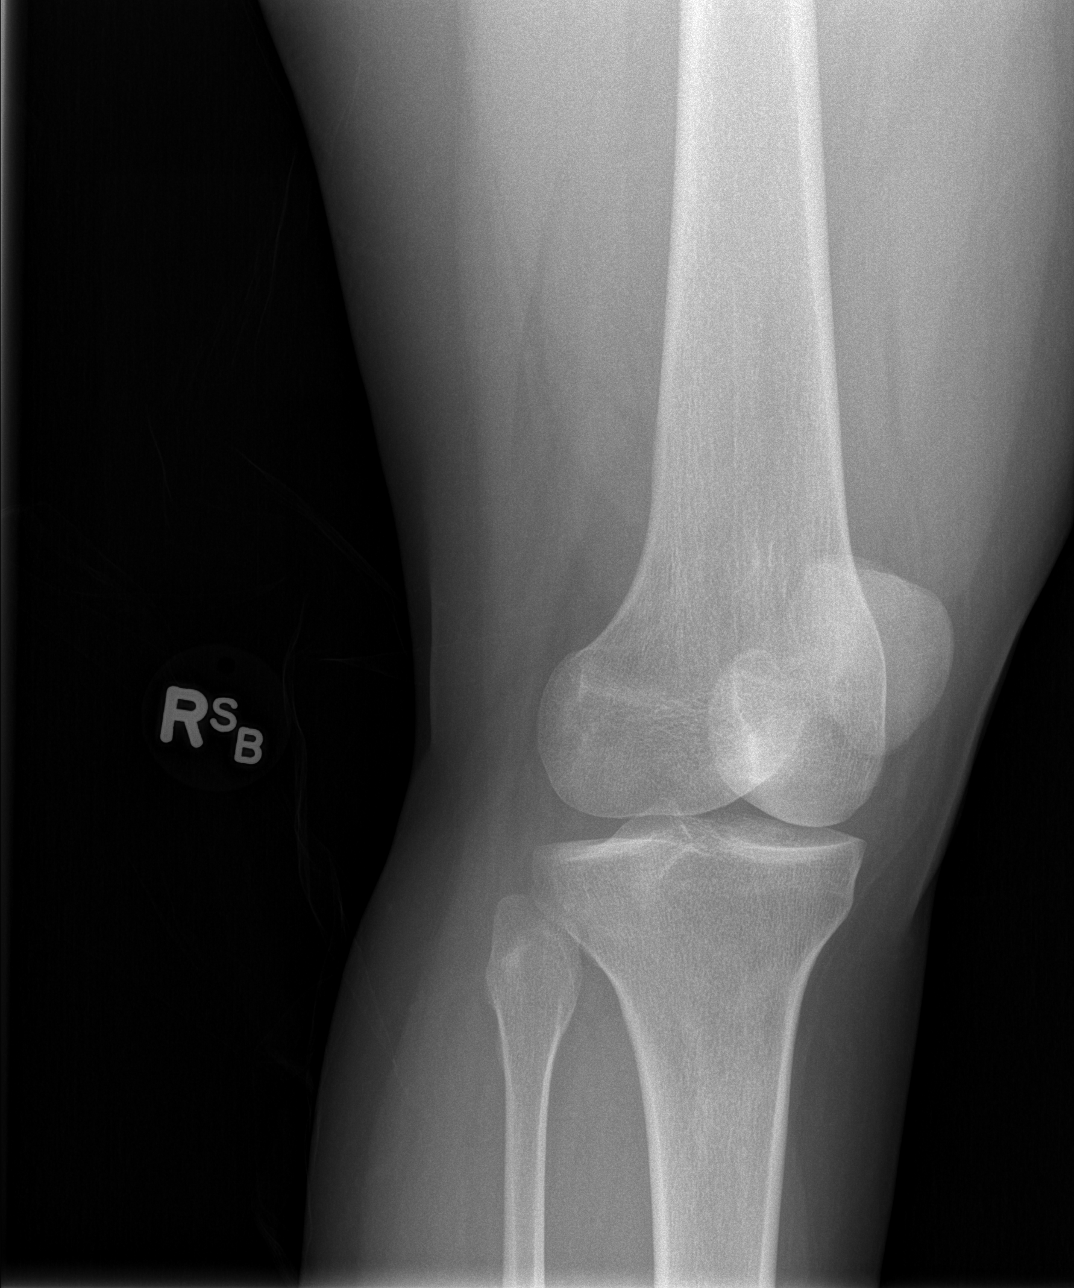

[t knee oblique right (2 of 2)]
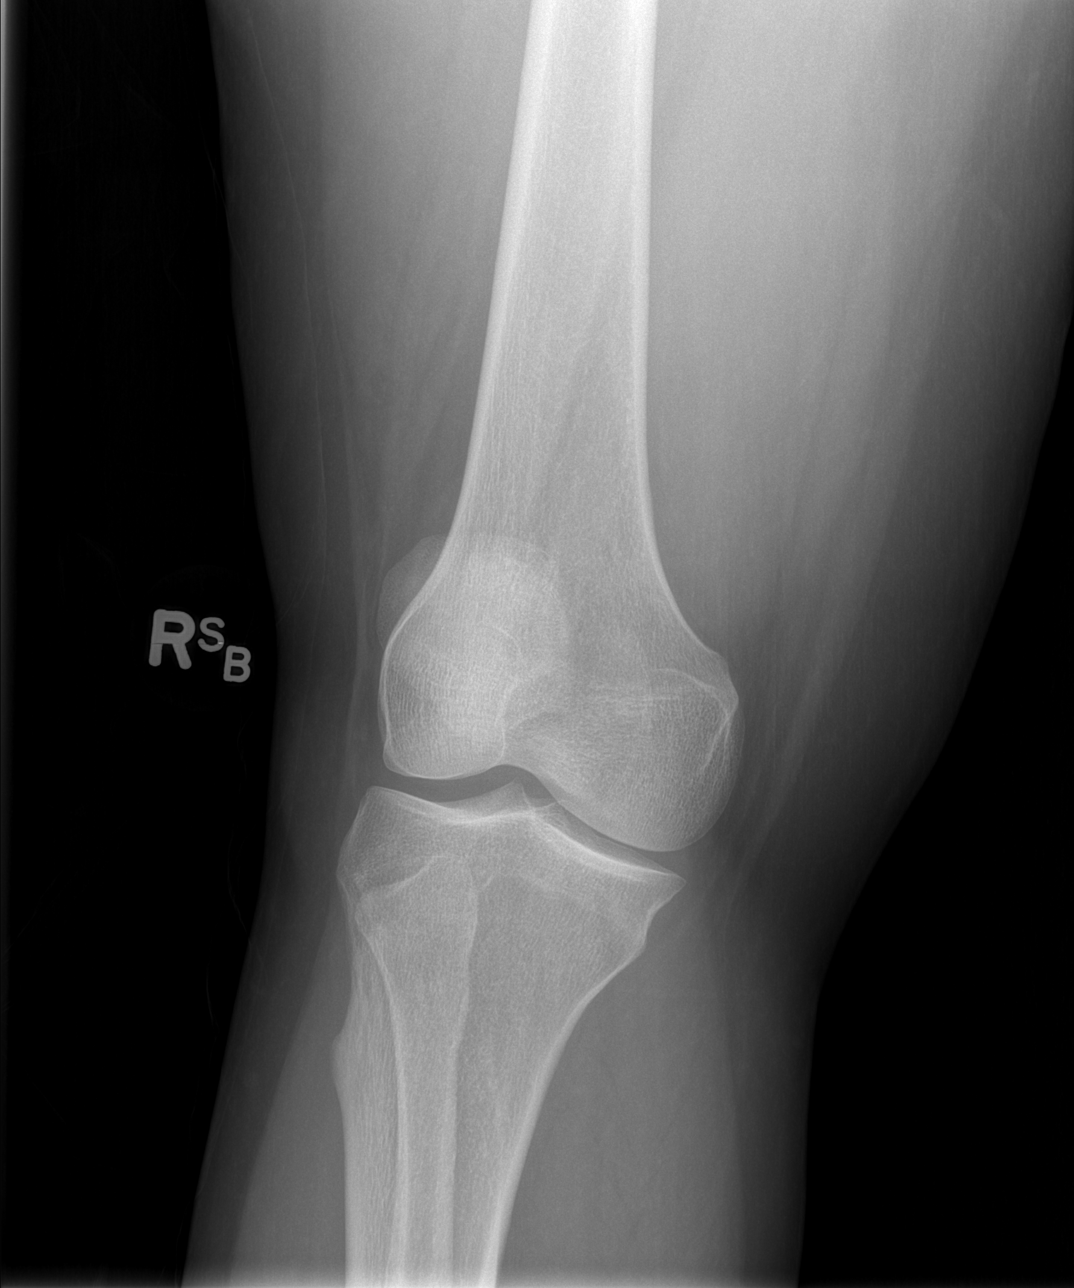

[t knee lat right]
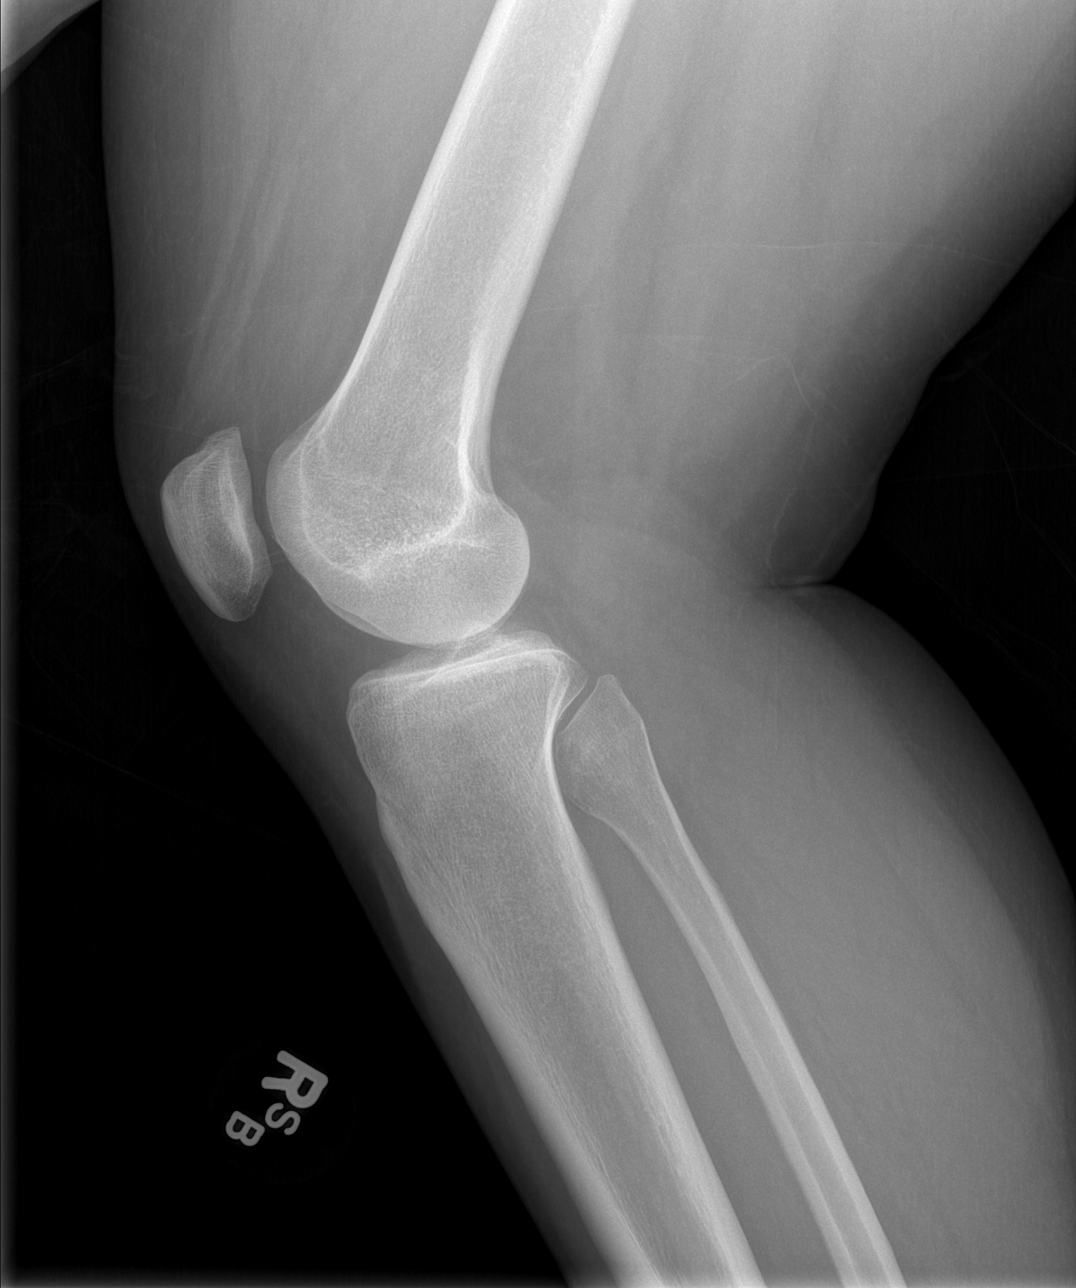

[4 of 4 positions shown; findings below may reference images not displayed]

FINDINGS: Osseous mineralization normal.

Joint spaces preserved.

No acute fracture, dislocation, or bone destruction.

No knee joint effusion.
IMPRESSION: Normal exam.

No interval change.

## 2016-06-21 ENCOUNTER — Emergency Department (HOSPITAL_BASED_OUTPATIENT_CLINIC_OR_DEPARTMENT_OTHER): Payer: Self-pay

## 2016-06-21 ENCOUNTER — Emergency Department (HOSPITAL_BASED_OUTPATIENT_CLINIC_OR_DEPARTMENT_OTHER)
Admission: EM | Admit: 2016-06-21 | Discharge: 2016-06-21 | Disposition: A | Discharge status: Home / Self Care | Payer: Self-pay | Attending: Emergency Medicine | Admitting: Emergency Medicine

## 2016-06-21 ENCOUNTER — Encounter (HOSPITAL_BASED_OUTPATIENT_CLINIC_OR_DEPARTMENT_OTHER): Payer: Self-pay | Admitting: *Deleted

## 2016-06-21 DIAGNOSIS — R1031 Right lower quadrant pain: Secondary | ICD-10-CM

## 2016-06-21 DIAGNOSIS — Z711 Person with feared health complaint in whom no diagnosis is made: Secondary | ICD-10-CM

## 2016-06-21 DIAGNOSIS — R102 Pelvic and perineal pain: Secondary | ICD-10-CM

## 2016-06-21 DIAGNOSIS — F17210 Nicotine dependence, cigarettes, uncomplicated: Secondary | ICD-10-CM | POA: Insufficient documentation

## 2016-06-21 DIAGNOSIS — N760 Acute vaginitis: Secondary | ICD-10-CM

## 2016-06-21 DIAGNOSIS — B9689 Other specified bacterial agents as the cause of diseases classified elsewhere: Secondary | ICD-10-CM

## 2016-06-21 LAB — CBC WITH DIFFERENTIAL/PLATELET
BASOS ABS: 0 10*3/uL (ref 0.0–0.1)
BASOS PCT: 0 %
EOS PCT: 0 %
Eosinophils Absolute: 0 10*3/uL (ref 0.0–0.7)
HCT: 38.5 % (ref 36.0–46.0)
Hemoglobin: 12.6 g/dL (ref 12.0–15.0)
Lymphocytes Relative: 50 %
Lymphs Abs: 3.5 10*3/uL (ref 0.7–4.0)
MCH: 28 pg (ref 26.0–34.0)
MCHC: 32.7 g/dL (ref 30.0–36.0)
MCV: 85.6 fL (ref 78.0–100.0)
MONO ABS: 0.6 10*3/uL (ref 0.1–1.0)
MONOS PCT: 9 %
NEUTROS ABS: 2.9 10*3/uL (ref 1.7–7.7)
Neutrophils Relative %: 41 %
PLATELETS: 232 10*3/uL (ref 150–400)
RBC: 4.5 MIL/uL (ref 3.87–5.11)
RDW: 13.5 % (ref 11.5–15.5)
WBC: 7 10*3/uL (ref 4.0–10.5)

## 2016-06-21 LAB — COMPREHENSIVE METABOLIC PANEL
ALBUMIN: 4.1 g/dL (ref 3.5–5.0)
ALK PHOS: 54 U/L (ref 38–126)
ALT: 20 U/L (ref 14–54)
ANION GAP: 7 (ref 5–15)
AST: 25 U/L (ref 15–41)
BILIRUBIN TOTAL: 0.7 mg/dL (ref 0.3–1.2)
BUN: 8 mg/dL (ref 6–20)
CALCIUM: 9.3 mg/dL (ref 8.9–10.3)
CO2: 26 mmol/L (ref 22–32)
CREATININE: 0.85 mg/dL (ref 0.44–1.00)
Chloride: 103 mmol/L (ref 101–111)
GFR calc non Af Amer: >60 mL/min (ref >60)
GLUCOSE: 87 mg/dL (ref 65–99)
Potassium: 4.3 mmol/L (ref 3.5–5.1)
Sodium: 136 mmol/L (ref 135–145)
TOTAL PROTEIN: 7.4 g/dL (ref 6.5–8.1)

## 2016-06-21 LAB — WET PREP, GENITAL
Sperm: NONE SEEN
TRICH WET PREP: NONE SEEN
Yeast Wet Prep HPF POC: NONE SEEN

## 2016-06-21 LAB — URINALYSIS, ROUTINE W REFLEX MICROSCOPIC
BILIRUBIN URINE: NEGATIVE
GLUCOSE, UA: NEGATIVE mg/dL
Hgb urine dipstick: NEGATIVE
KETONES UR: NEGATIVE mg/dL
Leukocytes, UA: NEGATIVE
Nitrite: NEGATIVE
PH: 5 (ref 5.0–8.0)
Protein, ur: NEGATIVE mg/dL
SPECIFIC GRAVITY, URINE: 1.022 (ref 1.005–1.030)

## 2016-06-21 LAB — PREGNANCY, URINE: Preg Test, Ur: NEGATIVE

## 2016-06-21 MED ORDER — ONDANSETRON HCL 4 MG/2ML IJ SOLN
4.0000 mg | Freq: Once | INTRAMUSCULAR | Status: AC
  Administered 2016-06-21: 4 mg via INTRAVENOUS
  Filled 2016-06-21: qty 2

## 2016-06-21 MED ORDER — METRONIDAZOLE 500 MG PO TABS: 500.0000 mg | ORAL_TABLET | Freq: Two times a day (BID) | ORAL | Status: DC

## 2016-06-21 MED ORDER — MORPHINE SULFATE (PF) 4 MG/ML IV SOLN
4.0000 mg | Freq: Once | INTRAVENOUS | Status: AC
  Administered 2016-06-21: 4 mg via INTRAVENOUS
  Filled 2016-06-21: qty 1

## 2016-06-21 MED ORDER — FLUCONAZOLE 150 MG PO TABS: 150.0000 mg | ORAL_TABLET | Freq: Once | ORAL | Status: DC

## 2016-06-21 MED ORDER — NAPROXEN 250 MG PO TABS: 250.0000 mg | ORAL_TABLET | Freq: Two times a day (BID) | ORAL | Status: DC

## 2016-06-21 MED ORDER — CEFTRIAXONE SODIUM 250 MG IJ SOLR
250.0000 mg | Freq: Once | INTRAMUSCULAR | Status: AC
  Administered 2016-06-21: 250 mg via INTRAMUSCULAR
  Filled 2016-06-21: qty 250

## 2016-06-21 MED ORDER — AZITHROMYCIN 250 MG PO TABS
1000.0000 mg | ORAL_TABLET | Freq: Once | ORAL | Status: AC
  Administered 2016-06-21: 1000 mg via ORAL
  Filled 2016-06-21: qty 4

## 2016-06-21 NOTE — ED Notes (Signed)
Pt c/o right lower abd pain x 2 days 

## 2016-06-21 NOTE — Discharge Instructions (Signed)
Bacterial Vaginosis °Bacterial vaginosis is a vaginal infection that occurs when the normal balance of bacteria in the vagina is disrupted. It results from an overgrowth of certain bacteria. This is the most common vaginal infection in women of childbearing age. Treatment is important to prevent complications, especially in pregnant women, as it can cause a premature delivery. °CAUSES  °Bacterial vaginosis is caused by an increase in harmful bacteria that are normally present in smaller amounts in the vagina. Several different kinds of bacteria can cause bacterial vaginosis. However, the reason that the condition develops is not fully understood. °RISK FACTORS °Certain activities or behaviors can put you at an increased risk of developing bacterial vaginosis, including: °· Having a new sex partner or multiple sex partners. °· Douching. °· Using an intrauterine device (IUD) for contraception. °Women do not get bacterial vaginosis from toilet seats, bedding, swimming pools, or contact with objects around them. °SIGNS AND SYMPTOMS  °Some women with bacterial vaginosis have no signs or symptoms. Common symptoms include: °· Grey vaginal discharge. °· A fishlike odor with discharge, especially after sexual intercourse. °· Itching or burning of the vagina and vulva. °· Burning or pain with urination. °DIAGNOSIS  °Your health care provider will take a medical history and examine the vagina for signs of bacterial vaginosis. A sample of vaginal fluid may be taken. Your health care provider will look at this sample under a microscope to check for bacteria and abnormal cells. A vaginal pH test may also be done.  °TREATMENT  °Bacterial vaginosis may be treated with antibiotic medicines. These may be given in the form of a pill or a vaginal cream. A second round of antibiotics may be prescribed if the condition comes back after treatment. Because bacterial vaginosis increases your risk for sexually transmitted diseases, getting  treated can help reduce your risk for chlamydia, gonorrhea, HIV, and herpes. °HOME CARE INSTRUCTIONS  °· Only take over-the-counter or prescription medicines as directed by your health care provider. °· If antibiotic medicine was prescribed, take it as directed. Make sure you finish it even if you start to feel better. °· Tell all sexual partners that you have a vaginal infection. They should see their health care provider and be treated if they have problems, such as a mild rash or itching. °· During treatment, it is important that you follow these instructions: °· Avoid sexual activity or use condoms correctly. °· Do not douche. °· Avoid alcohol as directed by your health care provider. °· Avoid breastfeeding as directed by your health care provider. °SEEK MEDICAL CARE IF:  °· Your symptoms are not improving after 3 days of treatment. °· You have increased discharge or pain. °· You have a fever. °MAKE SURE YOU:  °· Understand these instructions. °· Will watch your condition. °· Will get help right away if you are not doing well or get worse. °FOR MORE INFORMATION  °Centers for Disease Control and Prevention, Division of STD Prevention: www.cdc.gov/std °American Sexual Health Association (ASHA): www.ashastd.org  °  °This information is not intended to replace advice given to you by your health care provider. Make sure you discuss any questions you have with your health care provider. °  °Document Released: 12/11/2005 Document Revised: 01/01/2015 Document Reviewed: 07/23/2013 °Elsevier Interactive Patient Education ©2016 [employer] ° °Sexually Transmitted Disease °A sexually transmitted disease (STD) is a disease or infection that may be passed (transmitted) from person to person, usually during sexual activity. This may happen by way of saliva, semen, blood, vaginal   mucus, or urine. Common STDs include: °· Gonorrhea. °· Chlamydia. °· Syphilis. °· HIV and AIDS. °· Genital herpes. °· Hepatitis B and  C. °· Trichomonas. °· Human papillomavirus (HPV). °· Pubic lice. °· Scabies. °· Mites. °· Bacterial vaginosis. °WHAT ARE CAUSES OF STDs? °An STD may be caused by bacteria, a virus, or parasites. STDs are often transmitted during sexual activity if one person is infected. However, they may also be transmitted through nonsexual means. STDs may be transmitted after:  °· Sexual intercourse with an infected person. °· Sharing sex toys with an infected person. °· Sharing needles with an infected person or using unclean piercing or tattoo needles. °· Having intimate contact with the genitals, mouth, or rectal areas of an infected person. °· Exposure to infected fluids during birth. °WHAT ARE THE SIGNS AND SYMPTOMS OF STDs? °Different STDs have different symptoms. Some people may not have any symptoms. If symptoms are present, they may include: °· Painful or bloody urination. °· Pain in the pelvis, abdomen, vagina, anus, throat, or eyes. °· A skin rash, itching, or irritation. °· Growths, ulcerations, blisters, or sores in the genital and anal areas. °· Abnormal vaginal discharge with or without bad odor. °· Penile discharge in men. °· Fever. °· Pain or bleeding during sexual intercourse. °· Swollen glands in the groin area. °· Yellow skin and eyes (jaundice). This is seen with hepatitis. °· Swollen testicles. °· Infertility. °· Sores and blisters in the mouth. °HOW ARE STDs DIAGNOSED? °To make a diagnosis, your health care provider may: °· Take a medical history. °· Perform a physical exam. °· Take a sample of any discharge to examine. °· Swab the throat, cervix, opening to the penis, rectum, or vagina for testing. °· Test a sample of your first morning urine. °· Perform blood tests. °· Perform a Pap test, if this applies. °· Perform a colposcopy. °· Perform a laparoscopy. °HOW ARE STDs TREATED? °Treatment depends on the STD. Some STDs may be treated but not cured. °· Chlamydia, gonorrhea, trichomonas, and syphilis can be  cured with antibiotic medicine. °· Genital herpes, hepatitis, and HIV can be treated, but not cured, with prescribed medicines. The medicines lessen symptoms. °· Genital warts from HPV can be treated with medicine or by freezing, burning (electrocautery), or surgery. Warts may come back. °· HPV cannot be cured with medicine or surgery. However, abnormal areas may be removed from the cervix, vagina, or vulva. °· If your diagnosis is confirmed, your recent sexual partners need treatment. This is true even if they are symptom-free or have a negative culture or evaluation. They should not have sex until their health care providers say it is okay. °· Your health care provider may test you for infection again 3 months after treatment. °HOW CAN I REDUCE MY RISK OF GETTING AN STD? °Take these steps to reduce your risk of getting an STD: °· Use latex condoms, dental dams, and water-soluble lubricants during sexual activity. Do not use petroleum jelly or oils. °· Avoid having multiple sex partners. °· Do not have sex with someone who has other sex partners °· Do not have sex with anyone you do not know or who is at high risk for an STD. °· Avoid risky sex practices that can break your skin. °· Do not have sex if you have open sores on your mouth or skin. °· Avoid drinking too much alcohol or taking illegal drugs. Alcohol and drugs can affect your judgment and put you in a vulnerable position. °·   Avoid engaging in oral and anal sex acts. °· Get vaccinated for HPV and hepatitis. If you have not received these vaccines in the past, talk to your health care provider about whether one or both might be right for you. °· If you are at risk of being infected with HIV, it is recommended that you take a prescription medicine daily to prevent HIV infection. This is called pre-exposure prophylaxis (PrEP). You are considered at risk if: °¨ You are a man who has sex with other men (MSM). °¨ You are a heterosexual man or woman and are  sexually active with more than one partner. °¨ You take drugs by injection. °¨ You are sexually active with a partner who has HIV. °· Talk with your health care provider about whether you are at high risk of being infected with HIV. If you choose to begin PrEP, you should first be tested for HIV. You should then be tested every 3 months for as long as you are taking PrEP. °WHAT SHOULD I DO IF I THINK I HAVE AN STD? °· See your health care provider. °· Tell your sexual partner(s). They should be tested and treated for any STDs. °· Do not have sex until your health care provider says it is okay. °WHEN SHOULD I GET IMMEDIATE MEDICAL CARE? °Contact your health care provider right away if:  °· You have severe abdominal pain. °· You are a man and notice swelling or pain in your testicles. °· You are a woman and notice swelling or pain in your vagina. °  °This information is not intended to replace advice given to you by your health care provider. Make sure you discuss any questions you have with your health care provider. °  °Document Released: 03/03/2003 Document Revised: 01/01/2015 Document Reviewed: 07/01/2013 °Elsevier Interactive Patient Education ©2016 Elsevier Inc. ° °

## 2016-06-21 NOTE — ED Provider Notes (Signed)
CSN: [id number]     Arrival date & time 06/21/16  1430 History   First MD Initiated Contact with Patient 06/21/16 1634     Chief Complaint  Patient presents with  . Abdominal Pain    Natasha BadderMargaret Eyster is a 40 y.o. female who presents to the ED Complaining of right lower quadrant abdominal pain ongoing for the past 2 days. Patient also complains of urinary frequency and urgency for 2 days. Patient reports 2 days ago she had sudden onset of right lower quadrant abdominal pain that she currently rates it a 7 out of 10. She reports the pain has been constant. She is taken nothing for treatment of her symptoms today. She denies any nausea or vomiting. She reports her last bowel movement was loose. Patient is at a previous partial hysterectomy. She is a history of ovarian cysts. Patient has no other abdominal surgical history. Patient denies fevers, nausea, vomiting, hematochezia, dysuria, hematuria, vaginal bleeding, vaginal discharge, rashes, chest pain, shortness of breath.   Patient is a 40 y.o. female presenting with abdominal pain. The history is provided by the patient. No language interpreter was used.  Abdominal Pain Associated symptoms: no chest pain, no chills, no constipation, no cough, no diarrhea, no dysuria, no fever, no hematuria, no nausea, no shortness of breath, no sore throat, no vaginal bleeding, no vaginal discharge and no vomiting     Past Medical History  Diagnosis Date  . Ovarian cyst    Past Surgical History  Procedure Laterality Date  . Ovarian cyst surgery    . Abdominal hysterectomy      partial   History reviewed. No pertinent family history. Social History  Substance Use Topics  . Smoking status: Current Some Day Smoker -- 0.50 packs/day    Types: Cigarettes  . Smokeless tobacco: None  . Alcohol Use: No   OB History    No data available     Review of Systems  Constitutional: Negative for fever and chills.  HENT: Negative for congestion and sore throat.    Eyes: Negative for visual disturbance.  Respiratory: Negative for cough and shortness of breath.   Cardiovascular: Negative for chest pain.  Gastrointestinal: Positive for abdominal pain. Negative for nausea, vomiting, diarrhea, constipation, blood in stool and abdominal distention.  Genitourinary: Positive for urgency and frequency. Negative for dysuria, hematuria, flank pain, decreased urine volume, vaginal bleeding, vaginal discharge and difficulty urinating.  Musculoskeletal: Negative for back pain and neck pain.  Skin: Negative for rash.  Neurological: Negative for light-headedness and headaches.      Allergies  Review of patient's allergies indicates no known allergies.  Home Medications   Prior to Admission medications   Medication Sig Start Date End Date Taking? Authorizing Provider  acetaminophen (TYLENOL) 500 MG tablet Take 1 tablet (500 mg total) by mouth every 6 (six) hours as needed. 09/22/15   Mady GemmaElizabeth C Westfall, PA-C  metroNIDAZOLE (FLAGYL) 500 MG tablet Take 1 tablet (500 mg total) by mouth 2 (two) times daily. 06/21/16   Everlene FarrierWilliam Camber Ninh, PA-C  naproxen (NAPROSYN) 250 MG tablet Take 1 tablet (250 mg total) by mouth 2 (two) times daily with a meal. 06/21/16   Everlene FarrierWilliam Gay Rape, PA-C  omeprazole (PRILOSEC) 20 MG capsule Take 1 capsule (20 mg total) by mouth daily. 09/22/15   Mady GemmaElizabeth C Westfall, PA-C   BP 111/67 mmHg  Pulse 63  Temp(Src) 98.7 F (37.1 C)  Resp 18  Ht 5\' 1"  (1.549 m)  Wt 72.576 kg  BMI 30.25  kg/m2  SpO2 100% Physical Exam  Constitutional: She appears well-developed and well-nourished. No distress.  Nontoxic appearing.  HENT:  Head: Normocephalic and atraumatic.  Mouth/Throat: Oropharynx is clear and moist.  Eyes: Conjunctivae are normal. Pupils are equal, round, and reactive to light. Right eye exhibits no discharge. Left eye exhibits no discharge.  Neck: Neck supple.  Cardiovascular: Normal rate, regular rhythm, normal heart sounds and intact  distal pulses.  Exam reveals no gallop and no friction rub.   No murmur heard. Pulmonary/Chest: Effort normal and breath sounds normal. No respiratory distress. She has no wheezes. She has no rales.  Abdominal: Soft. Bowel sounds are normal. She exhibits no distension and no mass. There is tenderness. There is no rebound and no guarding.  Abdomen soft. Bowel sounds are present. Patient has mild right lower quadrant tenderness to palpation. No rebound tenderness. No Rovsing sign. No psoas or obturator sign. No CVA or flank tenderness.  Genitourinary: Vaginal discharge found.  Pelvic exam performed by me with female RN chaperone. No external lesions or rashes noted. Cervix is surgically absent. No vaginal bleeding. Mild white vaginal discharge. Patient has right adnexal tenderness. No left adnexal tenderness. No suprapubic tenderness. No right lower quadrant tenderness.  Musculoskeletal: She exhibits no edema.  Lymphadenopathy:    She has no cervical adenopathy.  Neurological: She is alert. Coordination normal.  Skin: Skin is warm and dry. No rash noted. She is not diaphoretic. No erythema. No pallor.  Psychiatric: She has a normal mood and affect. Her behavior is normal.  Nursing note and vitals reviewed.   ED Course  Procedures (including critical care time) Labs Review Labs Reviewed  WET PREP, GENITAL - Abnormal; Notable for the following:    Clue Cells Wet Prep HPF POC PRESENT (*)    WBC, Wet Prep HPF POC MANY (*)    All other components within normal limits  URINALYSIS, ROUTINE W REFLEX MICROSCOPIC (NOT AT [facility])  PREGNANCY, URINE  COMPREHENSIVE METABOLIC PANEL  CBC WITH DIFFERENTIAL/PLATELET  GC/CHLAMYDIA PROBE AMP ([place]) NOT AT [facility]    Imaging Review [place] Transvaginal Non-ob  06/21/2016  CLINICAL DATA:  Right pelvic pain for 2 days EXAM: TRANSABDOMINAL AND TRANSVAGINAL ULTRASOUND OF PELVIS DOPPLER ULTRASOUND OF OVARIES TECHNIQUE: Both transabdominal and transvaginal  ultrasound examinations of the pelvis were performed. Transabdominal technique was performed for global imaging of the pelvis including uterus, ovaries, adnexal regions, and pelvic cul-de-sac. It was necessary to proceed with endovaginal exam following the transabdominal exam to visualize the ovaries. Color and duplex Doppler ultrasound was utilized to evaluate blood flow to the ovaries. COMPARISON:  None. FINDINGS: Uterus Surgically removed Endometrium Surgically removed Right ovary Measurements: 2 x 1.6 x 3.1 cm. There is increased vascularity of the right ovary compared to the left ovary. Normal appearance/no adnexal mass. Left ovary Measurements: 2.1 x 1.2 x 2 cm. Normal appearance/no adnexal mass. Pulsed Doppler evaluation of both ovaries demonstrates normal low-resistance arterial and venous waveforms. Other findings No abnormal free fluid. IMPRESSION: Increased vascularity of the right ovary compared to the left ovary without focal discrete ovarian mass. This is nonspecific. There is no evidence of ovarian torsion bilaterally. Status post prior hysterectomy. Electronically Signed   By: Sherian ReinWei-Chen  Lin M.D.   On: 06/21/2016 18:15   [place] Pelvis Complete  06/21/2016  CLINICAL DATA:  Right pelvic pain for 2 days EXAM: TRANSABDOMINAL AND TRANSVAGINAL ULTRASOUND OF PELVIS DOPPLER ULTRASOUND OF OVARIES TECHNIQUE: Both transabdominal and transvaginal ultrasound examinations of the pelvis were performed. Transabdominal  technique was performed for global imaging of the pelvis including uterus, ovaries, adnexal regions, and pelvic cul-de-sac. It was necessary to proceed with endovaginal exam following the transabdominal exam to visualize the ovaries. Color and duplex Doppler ultrasound was utilized to evaluate blood flow to the ovaries. COMPARISON:  None. FINDINGS: Uterus Surgically removed Endometrium Surgically removed Right ovary Measurements: 2 x 1.6 x 3.1 cm. There is increased vascularity of the right ovary  compared to the left ovary. Normal appearance/no adnexal mass. Left ovary Measurements: 2.1 x 1.2 x 2 cm. Normal appearance/no adnexal mass. Pulsed Doppler evaluation of both ovaries demonstrates normal low-resistance arterial and venous waveforms. Other findings No abnormal free fluid. IMPRESSION: Increased vascularity of the right ovary compared to the left ovary without focal discrete ovarian mass. This is nonspecific. There is no evidence of ovarian torsion bilaterally. Status post prior hysterectomy. Electronically Signed   By: Sherian Rein M.D.   On: 06/21/2016 18:15   [place] Art/ven Flow Abd Pelv Doppler  06/21/2016  CLINICAL DATA:  Right pelvic pain for 2 days EXAM: TRANSABDOMINAL AND TRANSVAGINAL ULTRASOUND OF PELVIS DOPPLER ULTRASOUND OF OVARIES TECHNIQUE: Both transabdominal and transvaginal ultrasound examinations of the pelvis were performed. Transabdominal technique was performed for global imaging of the pelvis including uterus, ovaries, adnexal regions, and pelvic cul-de-sac. It was necessary to proceed with endovaginal exam following the transabdominal exam to visualize the ovaries. Color and duplex Doppler ultrasound was utilized to evaluate blood flow to the ovaries. COMPARISON:  None. FINDINGS: Uterus Surgically removed Endometrium Surgically removed Right ovary Measurements: 2 x 1.6 x 3.1 cm. There is increased vascularity of the right ovary compared to the left ovary. Normal appearance/no adnexal mass. Left ovary Measurements: 2.1 x 1.2 x 2 cm. Normal appearance/no adnexal mass. Pulsed Doppler evaluation of both ovaries demonstrates normal low-resistance arterial and venous waveforms. Other findings No abnormal free fluid. IMPRESSION: Increased vascularity of the right ovary compared to the left ovary without focal discrete ovarian mass. This is nonspecific. There is no evidence of ovarian torsion bilaterally. Status post prior hysterectomy. Electronically Signed   By: Sherian Rein M.D.   On:  06/21/2016 18:15   I have personally reviewed and evaluated these images and lab results as part of my medical decision-making.   EKG Interpretation None      Filed Vitals:   06/21/16 1434 06/21/16 1703 06/21/16 1904  BP: 131/76 104/72 111/67  Pulse: 98 82 63  Temp: 98.7 F (37.1 C)    Resp: Height:  (1.549 m)    Weight: 72.576 kg    SpO2: 100% 100% 100%     MDM   Meds given in ED:  Medications  cefTRIAXone (ROCEPHIN) injection 250 mg (not administered)  azithromycin (ZITHROMAX) tablet 1,000 mg (not administered)  ondansetron (ZOFRAN) injection 4 mg (4 mg Intravenous Given 06/21/16 1705)  morphine 4 MG/ML injection 4 mg (4 mg Intravenous Given 06/21/16 1710)    New Prescriptions   METRONIDAZOLE (FLAGYL) 500 MG TABLET    Take 1 tablet (500 mg total) by mouth 2 (two) times daily.   NAPROXEN (NAPROSYN) 250 MG TABLET    Take 1 tablet (250 mg total) by mouth 2 (two) times daily with a meal.    Final diagnoses:  Concern about STD in female without diagnosis  Right adnexal tenderness  BV (bacterial vaginosis)   This is a 40 y.o. female who presents to the ED Complaining of right lower quadrant abdominal pain ongoing for the past  2 days. Patient also complains of urinary frequency and urgency for 2 days. Patient reports 2 days ago she had sudden onset of right lower quadrant abdominal pain that she currently rates it a 7 out of 10. She reports the pain has been constant. She is taken nothing for treatment of her symptoms today. She denies any nausea or vomiting. She reports her last bowel movement was loose. Patient is at a previous partial hysterectomy. She is a history of ovarian cysts. Patient has no other abdominal surgical history. Patient denies fevers, nausea, vomiting.  On exam the patient is afebrile nontoxic appearing. Her abdomen is soft and she has right adnexal tenderness to palpation. Pelvic exam shows white vaginal discharge. No cervical motion tenderness.  She has right adnexal tenderness. CBC and CMP are unremarkable. Urinalysis shows no signs of infection. Wet prep shows clue cells and many white blood cells.  Ultrasound reveals increased vascularity of the right ovary compared to the left ovary without focal discrete ovarian mass. No evidence of ovarian torsion bilaterally. At reevaluation prior to discharge patient reports she is pain-free. She's not had pain medicine approximately 4 hours. She has no current complaints. I advised of the white blood cells and concern for bacterial vaginosis. We'll treat with Rocephin and azithromycin for possible STD. We'll also discharge her with Flagyl for bacterial vaginosis. Naproxen for pain control. I have low suspicion for appendicitis as the patient has right adnexal tenderness. No rebound tenderness. No Rovsing sign. No so as or obturator sign. Patient also is afebrile and has no white count. I did discuss that this is an area where she could have appendicitis and that if her pain worsens or changes or she is not feeling better she should return to the emergency department for reevaluation. Patient agrees with this plan. We will have her follow-up with her OB/GYN Dr. Neva Seat. I advised the patient to follow-up with their primary care provider this week. I advised the patient to return to the emergency department with new or worsening symptoms or new concerns. The patient verbalized understanding and agreement with plan.        Everlene Farrier, PA-C 06/21/16 2033  Arby Barrette, MD 06/24/16 1130

## 2016-06-21 NOTE — ED Notes (Signed)
Pt requesting to have Diflucan prescription. Will make provider aware.

## 2016-06-22 LAB — GC/CHLAMYDIA PROBE AMP (~~LOC~~) NOT AT ARMC
CHLAMYDIA, DNA PROBE: NEGATIVE
Neisseria Gonorrhea: NEGATIVE

## 2017-02-12 ENCOUNTER — Encounter (HOSPITAL_BASED_OUTPATIENT_CLINIC_OR_DEPARTMENT_OTHER): Payer: Self-pay | Admitting: *Deleted

## 2017-02-12 ENCOUNTER — Emergency Department (HOSPITAL_BASED_OUTPATIENT_CLINIC_OR_DEPARTMENT_OTHER): Payer: Self-pay

## 2017-02-12 ENCOUNTER — Emergency Department (HOSPITAL_BASED_OUTPATIENT_CLINIC_OR_DEPARTMENT_OTHER)
Admission: EM | Admit: 2017-02-12 | Discharge: 2017-02-12 | Disposition: A | Discharge status: Home / Self Care | Payer: Self-pay | Attending: Emergency Medicine | Admitting: Emergency Medicine

## 2017-02-12 DIAGNOSIS — M545 Low back pain, unspecified: Secondary | ICD-10-CM

## 2017-02-12 DIAGNOSIS — M4046 Postural lordosis, lumbar region: Secondary | ICD-10-CM

## 2017-02-12 DIAGNOSIS — M4056 Lordosis, unspecified, lumbar region: Secondary | ICD-10-CM | POA: Insufficient documentation

## 2017-02-12 DIAGNOSIS — F17210 Nicotine dependence, cigarettes, uncomplicated: Secondary | ICD-10-CM | POA: Insufficient documentation

## 2017-02-12 DIAGNOSIS — R0789 Other chest pain: Secondary | ICD-10-CM | POA: Insufficient documentation

## 2017-02-12 LAB — URINALYSIS, ROUTINE W REFLEX MICROSCOPIC
Bilirubin Urine: NEGATIVE
Glucose, UA: NEGATIVE mg/dL
Hgb urine dipstick: NEGATIVE
KETONES UR: NEGATIVE mg/dL
LEUKOCYTES UA: NEGATIVE
NITRITE: NEGATIVE
PH: 6 (ref 5.0–8.0)
Protein, ur: NEGATIVE mg/dL
SPECIFIC GRAVITY, URINE: 1.012 (ref 1.005–1.030)

## 2017-02-12 MED ORDER — MELOXICAM 15 MG PO TABS: 15.0000 mg | ORAL_TABLET | Freq: Every day | ORAL | 0 refills | Status: DC

## 2017-02-12 MED ORDER — IBUPROFEN 800 MG PO TABS
800.0000 mg | ORAL_TABLET | Freq: Once | ORAL | Status: AC
  Administered 2017-02-12: 800 mg via ORAL
  Filled 2017-02-12: qty 1

## 2017-02-12 MED ORDER — BACLOFEN 10 MG PO TABS: 10.0000 mg | ORAL_TABLET | Freq: Three times a day (TID) | ORAL | 0 refills | Status: DC

## 2017-02-12 NOTE — ED Notes (Signed)
Patient transported to X-ray 

## 2017-02-12 NOTE — ED Triage Notes (Signed)
Pt reports right flank pain and urinary frequency x 2 weeks.

## 2017-02-12 NOTE — ED Provider Notes (Signed)
[facility] Provider Note   CSN: [id number] Arrival date & time: 02/12/17  1456  By signing my name below, I, Arianna Nassar, attest that this documentation has been prepared under the direction and in the presence of Arthor Captain, PA-C.  Electronically Signed: Octavia Heir, ED Scribe. 02/12/17. 4:30 PM.    History   Chief Complaint Chief Complaint  Patient presents with  . Flank Pain   The history is provided by the patient. No language interpreter was used.   HPI Comments: Natasha Nolan is a 41 y.o. female who presents to the Emergency Department complaining of moderate, persisting lower back pain x 5 days. She reports associated urinary frequency. Pt says that laying down or any certain movements exacerbated her back pain. She notes having to sleep upright in a recliner with a pillow behind her back, which gives her minimal relief. She has not taken any medication to alleviate her back pain. She denies vaginal discharge, vaginal bleeding, pain with intercourse, numbness or tingling in lower extremities, numbness or tingling in her vaginal region, and bladder or bowel incontinence.    Pt further expresses moderate central chest wall pain that has been occurring intermittently for the past month and a half. She describes the pain as a sharp sensation. Pt was seen at [facility] on 12/30/16 for acute onset chest pain.  Pt had an EKG performed that was unremarkable, but was admitted to the hospital due to the severity of the illness and comorbidities.She was discharged on 12/31/16 and was advised to follow up with her PCP in one week if symptoms persisted. Pt is a smoker.   Past Medical History:  Diagnosis Date  . Ovarian cyst     There are no active problems to display for this patient.   Past Surgical History:  Procedure Laterality Date  . ABDOMINAL HYSTERECTOMY     partial  . OVARIAN CYST SURGERY      OB History    No data available       Home Medications      Prior to Admission medications   Not on File    Family History History reviewed. No pertinent family history.  Social History Social History  Substance Use Topics  . Smoking status: Current Some Day Smoker    Packs/day: 0.50    Types: Cigarettes  . Smokeless tobacco: Not on file  . Alcohol use No     Allergies   Patient has no known allergies.   Review of Systems Review of Systems  A complete 10 system review of systems was obtained and all systems are negative except as noted in the HPI and PMH.   Physical Exam Updated Vital Signs BP 132/82 (BP Location: Left Arm)   Pulse 67   Temp 98.7 F (37.1 C) (Oral)   Resp 16   Ht 5\' 1"  (1.549 m)   Wt 160 lb (72.6 kg)   SpO2 100%   BMI 30.23 kg/m   Physical Exam  Constitutional: She is oriented to person, place, and time. She appears well-developed and well-nourished. No distress.  HENT:  Head: Normocephalic and atraumatic.  Eyes: Conjunctivae and EOM are normal. No scleral icterus.  Neck: Normal range of motion.  Cardiovascular: Normal rate, regular rhythm and normal heart sounds.  Exam reveals no gallop and no friction rub.   No murmur heard. Pulmonary/Chest: Effort normal and breath sounds normal. No respiratory distress.  Abdominal: Soft. Bowel sounds are normal. She exhibits no distension and no mass. There  is no tenderness. There is no guarding.  Musculoskeletal: Normal range of motion.  Hyperlordosis and TTP along the bilateral lumbar paraspinal muscles that is worse with palpation and movement. No CVA tenderness.   Neurological: She is alert and oriented to person, place, and time.  Skin: Skin is warm and dry. She is not diaphoretic.  Psychiatric: She has a normal mood and affect.  Nursing note and vitals reviewed.    ED Treatments / Results  DIAGNOSTIC STUDIES: Oxygen Saturation is 100% on RA, normal by my interpretation.  COORDINATION OF CARE:  4:23 PM Discussed treatment plan with pt at bedside and  pt agreed to plan.  Labs (all labs ordered are listed, but only abnormal results are displayed) Labs Reviewed  URINALYSIS, ROUTINE W REFLEX MICROSCOPIC    EKG  EKG Interpretation None       Radiology No results found.  Procedures Procedures (including critical care time)  Medications Ordered in ED Medications - No data to display   Initial Impression / Assessment and Plan / ED Course  I have reviewed the triage vital signs and the nursing notes.  Pertinent labs & imaging results that were available during my care of the patient were reviewed by me and considered in my medical decision making (see chart for details).      Patient with lumbar msuculoskeltal pain. She is hyperlordotic and pain is reproducible. Patient also had Reproducible chest wall pain. It is also positional. Her EKG is normal, so signs of pericarditis. She had a major workup for chest [facility] about 2 weeks ago [facility] doesn't included observation, a CT angiogram that was negative, no elevated troponins and a normal EKG. EKG is unchanged from her previous reading. If that she is safe for discharge at this time. I have discussed return precautions. Final Clinical Impressions(s) / ED Diagnoses   Final diagnoses:  Acute bilateral low back pain without sciatica  Exaggerated lumbar lordosis  Chest wall discomfort   I personally performed the services described in this documentation, which was scribed in my presence. The recorded information has been reviewed and is accurate.    New Prescriptions New Prescriptions   BACLOFEN (LIORESAL) 10 MG TABLET    Take 1 tablet (10 mg total) by mouth 3 (three) times daily.   MELOXICAM (MOBIC) 15 MG TABLET    Take 1 tablet (15 mg total) by mouth daily. Take 1 daily with food.     Arthor CaptainAbigail Sarah-Jane Nazario, PA-C 02/12/17 1732    Loren Raceravid Yelverton, MD 02/13/17 [phone]

## 2017-02-12 NOTE — Discharge Instructions (Signed)
Be aware that the muscle relaxer (baclofen) can make you sleepy. Try sleeping with some support under your knees to help take pressure off of your low back. Read and try some of the exercises for back pain.   SEEK IMMEDIATE MEDICAL ATTENTION IF: New numbness, tingling, weakness, or problem with the use of your arms or legs.  Severe back pain not relieved with medications.  Change in bowel or bladder control.  Increasing pain in any areas of the body (such as chest or abdominal pain).  Shortness of breath, dizziness or fainting.  Nausea (feeling sick to your stomach), vomiting, fever, or sweats.  Your caregiver has diagnosed you as having chest pain that is not specific for one problem, but does not require admission.  You are at low risk for an acute heart condition or other serious illness. Chest pain comes from many different causes.  SEEK IMMEDIATE MEDICAL ATTENTION IF: You have severe chest pain, especially if the pain is crushing or pressure-like and spreads to the arms, back, neck, or jaw, or if you have sweating, nausea (feeling sick to your stomach), or shortness of breath. THIS IS AN EMERGENCY. Don't wait to see if the pain will go away. Get medical help at once. Call 911 or 0 (operator). DO NOT drive yourself to the hospital.  Your chest pain gets worse and does not go away with rest.  You have an attack of chest pain lasting longer than usual, despite rest and treatment with the medications your caregiver has prescribed.  You wake from sleep with chest pain or shortness of breath.  You feel dizzy or faint.  You have chest pain not typical of your usual pain for which you originally saw your caregiver.

## 2018-02-10 ENCOUNTER — Encounter (HOSPITAL_BASED_OUTPATIENT_CLINIC_OR_DEPARTMENT_OTHER): Payer: Self-pay

## 2018-02-10 ENCOUNTER — Emergency Department (HOSPITAL_BASED_OUTPATIENT_CLINIC_OR_DEPARTMENT_OTHER)
Admission: EM | Admit: 2018-02-10 | Discharge: 2018-02-10 | Disposition: A | Discharge status: Home / Self Care | Payer: Self-pay | Attending: Emergency Medicine | Admitting: Emergency Medicine

## 2018-02-10 ENCOUNTER — Other Ambulatory Visit: Payer: Self-pay

## 2018-02-10 DIAGNOSIS — Z79899 Other long term (current) drug therapy: Secondary | ICD-10-CM | POA: Insufficient documentation

## 2018-02-10 DIAGNOSIS — R6889 Other general symptoms and signs: Secondary | ICD-10-CM

## 2018-02-10 DIAGNOSIS — J111 Influenza due to unidentified influenza virus with other respiratory manifestations: Secondary | ICD-10-CM | POA: Insufficient documentation

## 2018-02-10 DIAGNOSIS — F17210 Nicotine dependence, cigarettes, uncomplicated: Secondary | ICD-10-CM | POA: Insufficient documentation

## 2018-02-10 NOTE — ED Provider Notes (Signed)
[facility] EMERGENCY DEPARTMENT Provider Note   CSN: [id number] Arrival date & time: 02/10/18  0746     History   Chief Complaint Chief Complaint  Patient presents with  . Generalized Body Aches    HPI Pincus BadderMargaret Olheiser is a 42 y.o. female.  Patient is a 42 year old female presenting with fevers, and vomiting with diarrhea.  PMH significant for migraines.  Patient feeling more tired as 2 days ago with abrupt onset of profuse vomiting and diarrhea.  Patient endorses vomiting every hour with NBNB emesis and frequent nonbloody diarrhea.  Vomiting has improved as of today with only an episode of nausea but diarrhea continues.  Patient measured fever 101F improved with use of Motrin.  Patient with generalized body ache without headache though endorses use of Motrin for chronic migraines.  Patient has been exposed to flu contacts including grandchildren.  She currently works in a nursing home.  She is a smoker, denies alcohol or illicit drug use.      Past Medical History:  Diagnosis Date  . Ovarian cyst     There are no active problems to display for this patient.   Past Surgical History:  Procedure Laterality Date  . ABDOMINAL HYSTERECTOMY     partial  . CHOLECYSTECTOMY    . OVARIAN CYST SURGERY      OB History    No data available       Home Medications    Prior to Admission medications   Medication Sig Start Date End Date Taking? Authorizing Provider  baclofen (LIORESAL) 10 MG tablet Take 1 tablet (10 mg total) by mouth 3 (three) times daily. 02/12/17   Arthor CaptainHarris, Abigail, PA-C  meloxicam (MOBIC) 15 MG tablet Take 1 tablet (15 mg total) by mouth daily. Take 1 daily with food. 02/12/17   Arthor CaptainHarris, Abigail, PA-C  promethazine (PHENERGAN) 25 MG tablet Take 1 tablet (25 mg total) by mouth every 6 (six) hours as needed for nausea. 02/17/13 05/31/15  Quita SkyeGhim, Michael, MD    Family History History reviewed. No pertinent family history.  Social History Social History     Tobacco Use  . Smoking status: Current Some Day Smoker    Packs/day: 0.50    Types: Cigarettes  . Smokeless tobacco: Never Used  Substance Use Topics  . Alcohol use: No  . Drug use: No     Allergies   Patient has no known allergies.   Review of Systems Review of Systems  Constitutional: Positive for chills and fever.  HENT: Negative for congestion, ear pain, rhinorrhea and sore throat.   Eyes: Negative for pain and visual disturbance.  Respiratory: Positive for cough. Negative for shortness of breath.   Cardiovascular: Negative for chest pain and palpitations.  Gastrointestinal: Positive for diarrhea, nausea and vomiting. Negative for abdominal pain.  Genitourinary: Negative for dysuria and hematuria.  Musculoskeletal: Positive for myalgias. Negative for arthralgias, back pain and neck pain.  Skin: Negative for color change and rash.  Neurological: Negative for dizziness and headaches.  All other systems reviewed and are negative.    Physical Exam Updated Vital Signs BP 121/76 (BP Location: Right Arm)   Pulse 69   Temp 98.1 F (36.7 C) (Oral)   Resp 20   Ht 5\' 1"  (1.549 m)   Wt 77.1 kg (170 lb)   SpO2 100%   BMI 32.12 kg/m   Physical Exam  Constitutional: She appears well-developed and well-nourished. No distress.  HENT:  Head: Normocephalic and atraumatic.  Mouth/Throat: No oropharyngeal  exudate.  Eyes: Conjunctivae and EOM are normal. Pupils are equal, round, and reactive to light.  Neck: Neck supple.  Cardiovascular: Normal rate and regular rhythm.  No murmur heard. Pulmonary/Chest: Effort normal and breath sounds normal. No respiratory distress. She has no wheezes. She has no rales.  Abdominal: Soft. Bowel sounds are normal. She exhibits no distension and no mass. There is no tenderness.  Musculoskeletal: She exhibits no edema.  Lymphadenopathy:    She has no cervical adenopathy.  Neurological: She is alert.  Skin: Skin is warm and dry. Capillary  refill takes less than 2 seconds. She is not diaphoretic.  Psychiatric: She has a normal mood and affect.  Nursing note and vitals reviewed.    ED Treatments / Results  Labs (all labs ordered are listed, but only abnormal results are displayed) Labs Reviewed - No data to display  EKG  EKG Interpretation None       Radiology No results found.  Procedures Procedures (including critical care time)  Medications Ordered in ED Medications - No data to display   Initial Impression / Assessment and Plan / ED Course  I have reviewed the triage vital signs and the nursing notes.  Pertinent labs & imaging results that were available during my care of the patient were reviewed by me and considered in my medical decision making (see chart for details).  Patient is a 42 year old female presenting with fevers, and vomiting with diarrhea.  PMH significant for migraines.  Vital stable and afebrile though patient did endorse taking Motrin 3 hours prior.  Symptoms consistent with flu which recent contact exposure make this more likely.  Discussed taking Tamiflu given symptom onset within the 48-hour window which patient declined.  Patient to drink plenty of fluids and take Tylenol and ibuprofen every 8 hours as needed for fever.  Provided prior to discharge given occupation.  Patient instructed to follow-up with PCP if symptoms do not improve over the next 3 days.  Reviewed return precautions  Final Clinical Impressions(s) / ED Diagnoses   Final diagnoses:  Flu-like symptoms    ED Discharge Orders    None       Wendee Beavers, DO 02/10/18 1610    Clarene Duke Ambrose Finland, MD 02/11/18 [phone]

## 2018-02-10 NOTE — Discharge Instructions (Signed)
I have given you a note for work.  Do not return to work unless you have been without fever for 24 hours without use of medication and complete resolution of vomiting.

## 2018-02-10 NOTE — ED Triage Notes (Addendum)
Pt presents to the ED with a CC of body aches, n/v/d, and cough. Reports 3 day history of illness. Vomited at 6am. Granddaughter recently had flu symptoms. Motrin taken at 5am. Fever 101.1 at 1am.

## 2018-02-12 ENCOUNTER — Encounter (HOSPITAL_BASED_OUTPATIENT_CLINIC_OR_DEPARTMENT_OTHER): Payer: Self-pay

## 2018-02-12 ENCOUNTER — Emergency Department (HOSPITAL_BASED_OUTPATIENT_CLINIC_OR_DEPARTMENT_OTHER): Payer: Self-pay

## 2018-02-12 ENCOUNTER — Other Ambulatory Visit: Payer: Self-pay

## 2018-02-12 ENCOUNTER — Emergency Department (HOSPITAL_BASED_OUTPATIENT_CLINIC_OR_DEPARTMENT_OTHER)
Admission: EM | Admit: 2018-02-12 | Discharge: 2018-02-12 | Disposition: A | Discharge status: Home / Self Care | Payer: Self-pay | Attending: Emergency Medicine | Admitting: Emergency Medicine

## 2018-02-12 DIAGNOSIS — J111 Influenza due to unidentified influenza virus with other respiratory manifestations: Secondary | ICD-10-CM | POA: Insufficient documentation

## 2018-02-12 DIAGNOSIS — F17210 Nicotine dependence, cigarettes, uncomplicated: Secondary | ICD-10-CM | POA: Insufficient documentation

## 2018-02-12 MED ORDER — ONDANSETRON 4 MG PO TBDP
ORAL_TABLET | ORAL | Status: AC
  Filled 2018-02-12: qty 1

## 2018-02-12 MED ORDER — ONDANSETRON 4 MG PO TBDP
4.0000 mg | ORAL_TABLET | Freq: Once | ORAL | Status: AC
  Administered 2018-02-12: 4 mg via ORAL

## 2018-02-12 NOTE — Discharge Instructions (Signed)
It was our pleasure to provide your ER care today - we hope that you feel better.  Rest. Drink plenty of fluids.  You may take acetaminophen and/or ibuprofen as need for fever and body aches.  Follow up with primary care doctor in 1 week if symptoms fail to improve/resolve.  Return to ER if worse, new symptoms, increased trouble breathing, persistent vomiting, other concern.

## 2018-02-12 NOTE — ED Provider Notes (Addendum)
[facility] EMERGENCY DEPARTMENT Provider Note   CSN: [id number] Arrival date & time: 02/12/18  1237     History   Chief Complaint Chief Complaint  Patient presents with  . Emesis    HPI Pincus BadderMargaret Straus is a 42 y.o. female.  Patient w recent vomiting and diarrhea illness, told was flu, now concerned as increased cough and mild sob. +fevers. Several family members recently tested positive for flu. Patient symptoms have been present for past several days. nvd is improved. No chest pain. Denies hx asthma. +smoker.    The history is provided by the patient.  Emesis   Associated symptoms include cough and a fever. Pertinent negatives include no abdominal pain and no headaches.    Past Medical History:  Diagnosis Date  . Ovarian cyst     There are no active problems to display for this patient.   Past Surgical History:  Procedure Laterality Date  . ABDOMINAL HYSTERECTOMY     partial  . CHOLECYSTECTOMY    . OVARIAN CYST SURGERY      OB History    No data available       Home Medications    Prior to Admission medications   Not on File    Family History No family history on file.  Social History Social History   Tobacco Use  . Smoking status: Current Some Day Smoker    Packs/day: 0.50    Types: Cigarettes  . Smokeless tobacco: Never Used  Substance Use Topics  . Alcohol use: No  . Drug use: No     Allergies   Patient has no known allergies.   Review of Systems Review of Systems  Constitutional: Positive for fever.  HENT: Positive for congestion.   Eyes: Negative for redness.  Respiratory: Positive for cough. Negative for shortness of breath.   Cardiovascular: Negative for chest pain.  Gastrointestinal: Positive for vomiting. Negative for abdominal pain.  Genitourinary: Negative for flank pain.  Musculoskeletal: Negative for neck pain and neck stiffness.  Skin: Negative for rash.  Neurological: Negative for headaches.    Hematological: Does not bruise/bleed easily.  Psychiatric/Behavioral: Negative for confusion.     Physical Exam Updated Vital Signs Pulse 69   Temp 98.7 F (37.1 C) (Oral)   Resp 18   Ht 1.549 m (5\' 1" )   Wt 77.1 kg (170 lb)   SpO2 100%   BMI 32.12 kg/m   Physical Exam  Constitutional: She appears well-developed and well-nourished. No distress.  HENT:  Mouth/Throat: Oropharynx is clear and moist.  Nasal congestion  Eyes: Conjunctivae are normal. No scleral icterus.  Neck: Neck supple. No tracheal deviation present.  No stiffness or rigidity  Cardiovascular: Normal rate, regular rhythm, normal heart sounds and intact distal pulses. Exam reveals no gallop and no friction rub.  No murmur heard. Pulmonary/Chest: Effort normal. No respiratory distress.  Non prod cough   Abdominal: Soft. Normal appearance and bowel sounds are normal. She exhibits no distension. There is no tenderness.  Musculoskeletal: She exhibits no edema.  Neurological: She is alert.  Skin: Skin is warm and dry. No rash noted. She is not diaphoretic.  Psychiatric: She has a normal mood and affect.  Nursing note and vitals reviewed.    ED Treatments / Results  Labs (all labs ordered are listed, but only abnormal results are displayed) Labs Reviewed - No data to display  EKG  EKG Interpretation None       Radiology Dg Chest 2 View  Result Date: 02/12/2018 CLINICAL DATA:  Nausea, diarrhea, fever and cough since February 17th EXAM: CHEST  2 VIEW COMPARISON:  August 14, 2017 FINDINGS: The heart size and mediastinal contours are within normal limits. Both lungs are clear. The visualized skeletal structures are unremarkable. IMPRESSION: No active cardiopulmonary disease. Electronically Signed   By: Sherian Rein M.D.   On: 02/12/2018 14:13    Procedures Procedures (including critical care time)  Medications Ordered in ED Medications - No data to display   Initial Impression / Assessment and Plan  / ED Course  I have reviewed the triage vital signs and the nursing notes.  Pertinent labs & imaging results that were available during my care of the patient were reviewed by me and considered in my medical decision making (see chart for details).  Cxr.  Reviewed nursing notes and prior charts for additional history.   Patient symptoms and exam are c/w flu.   rec symptomatic and supportive tx at home.  Pt with nausea. zofran po given.  Po fluids.   Patient appear stable for d/c.   Reviewed xrays - no pna.  Discussed xrays w pt.     Final Clinical Impressions(s) / ED Diagnoses   Final diagnoses:  None    ED Discharge Orders    None           Cathren Laine, MD 02/12/18 1448

## 2018-02-12 NOTE — ED Triage Notes (Signed)
C/o n/v/d-states she was seen for same 2/17-she felt better but yesterday-worse today-pt NAD-steady gait

## 2018-05-31 ENCOUNTER — Encounter (HOSPITAL_BASED_OUTPATIENT_CLINIC_OR_DEPARTMENT_OTHER): Payer: Self-pay

## 2018-05-31 ENCOUNTER — Emergency Department (HOSPITAL_BASED_OUTPATIENT_CLINIC_OR_DEPARTMENT_OTHER): Payer: Self-pay

## 2018-05-31 ENCOUNTER — Emergency Department (HOSPITAL_BASED_OUTPATIENT_CLINIC_OR_DEPARTMENT_OTHER)
Admission: EM | Admit: 2018-05-31 | Discharge: 2018-05-31 | Disposition: A | Discharge status: Home / Self Care | Payer: Self-pay | Attending: Emergency Medicine | Admitting: Emergency Medicine

## 2018-05-31 ENCOUNTER — Other Ambulatory Visit: Payer: Self-pay

## 2018-05-31 DIAGNOSIS — R1031 Right lower quadrant pain: Secondary | ICD-10-CM

## 2018-05-31 DIAGNOSIS — N83202 Unspecified ovarian cyst, left side: Secondary | ICD-10-CM | POA: Insufficient documentation

## 2018-05-31 DIAGNOSIS — F17210 Nicotine dependence, cigarettes, uncomplicated: Secondary | ICD-10-CM | POA: Insufficient documentation

## 2018-05-31 DIAGNOSIS — N83201 Unspecified ovarian cyst, right side: Secondary | ICD-10-CM | POA: Insufficient documentation

## 2018-05-31 LAB — COMPREHENSIVE METABOLIC PANEL
ALBUMIN: 4 g/dL (ref 3.5–5.0)
ALK PHOS: 51 U/L (ref 38–126)
ALT: 11 U/L — AB (ref 14–54)
AST: 22 U/L (ref 15–41)
Anion gap: 8 (ref 5–15)
BILIRUBIN TOTAL: 0.1 mg/dL — AB (ref 0.3–1.2)
BUN: 9 mg/dL (ref 6–20)
CALCIUM: 8.9 mg/dL (ref 8.9–10.3)
CO2: 26 mmol/L (ref 22–32)
CREATININE: 0.92 mg/dL (ref 0.44–1.00)
Chloride: 101 mmol/L (ref 101–111)
GFR calc Af Amer: >60 mL/min (ref >60)
GFR calc non Af Amer: >60 mL/min (ref >60)
GLUCOSE: 95 mg/dL (ref 65–99)
Potassium: 3.8 mmol/L (ref 3.5–5.1)
SODIUM: 135 mmol/L (ref 135–145)
TOTAL PROTEIN: 7.5 g/dL (ref 6.5–8.1)

## 2018-05-31 LAB — URINALYSIS, ROUTINE W REFLEX MICROSCOPIC
BILIRUBIN URINE: NEGATIVE
Glucose, UA: NEGATIVE mg/dL
Hgb urine dipstick: NEGATIVE
KETONES UR: NEGATIVE mg/dL
Leukocytes, UA: NEGATIVE
NITRITE: NEGATIVE
Protein, ur: NEGATIVE mg/dL
SPECIFIC GRAVITY, URINE: 1.025 (ref 1.005–1.030)
pH: 6 (ref 5.0–8.0)

## 2018-05-31 LAB — CBC
HCT: 36.5 % (ref 36.0–46.0)
HEMOGLOBIN: 12.3 g/dL (ref 12.0–15.0)
MCH: 28.7 pg (ref 26.0–34.0)
MCHC: 33.7 g/dL (ref 30.0–36.0)
MCV: 85.3 fL (ref 78.0–100.0)
Platelets: 260 10*3/uL (ref 150–400)
RBC: 4.28 MIL/uL (ref 3.87–5.11)
RDW: 13.4 % (ref 11.5–15.5)
WBC: 7 10*3/uL (ref 4.0–10.5)

## 2018-05-31 LAB — LIPASE, BLOOD: Lipase: 22 U/L (ref 11–51)

## 2018-05-31 LAB — PREGNANCY, URINE: PREG TEST UR: NEGATIVE

## 2018-05-31 MED ORDER — IBUPROFEN 600 MG PO TABS: 600.0000 mg | ORAL_TABLET | Freq: Three times a day (TID) | ORAL | 0 refills | Status: AC | PRN

## 2018-05-31 MED ORDER — HYDROCODONE-ACETAMINOPHEN 5-325 MG PO TABS
1.0000 | ORAL_TABLET | Freq: Once | ORAL | Status: AC
  Administered 2018-05-31: 1 via ORAL
  Filled 2018-05-31: qty 1

## 2018-05-31 MED ORDER — HYDROCODONE-ACETAMINOPHEN 5-325 MG PO TABS: 2.0000 | ORAL_TABLET | ORAL | 0 refills | Status: AC | PRN

## 2018-05-31 MED ORDER — SODIUM CHLORIDE 0.9 % IV BOLUS
1000.0000 mL | Freq: Once | INTRAVENOUS | Status: AC
Start: 2018-05-31 — End: 2018-05-31
  Administered 2018-05-31: 1000 mL via INTRAVENOUS

## 2018-05-31 MED ORDER — KETOROLAC TROMETHAMINE 30 MG/ML IJ SOLN
30.0000 mg | Freq: Once | INTRAMUSCULAR | Status: AC
  Administered 2018-05-31: 30 mg via INTRAVENOUS
  Filled 2018-05-31: qty 1

## 2018-05-31 MED ORDER — MORPHINE SULFATE (PF) 4 MG/ML IV SOLN
4.0000 mg | Freq: Once | INTRAVENOUS | Status: AC
  Administered 2018-05-31: 4 mg via INTRAVENOUS
  Filled 2018-05-31: qty 1

## 2018-05-31 NOTE — ED Provider Notes (Signed)
[facility] EMERGENCY DEPARTMENT Provider Note   CSN: [id number] Arrival date & time: 05/31/18  2011     History   Chief Complaint Chief Complaint  Patient presents with  . Abdominal Pain    HPI Natasha Nolan is a 42 y.o. female.  She is complaining of gradual onset of right lower quadrant abdominal pain since this morning.  About 3 PM it got acutely worse 10+ out of 10 sharp stabbing right lower quadrant with no radiation.  It is associated with some tingling in her fingers.  She also feels chills.  There is been no fever no chest pain no shortness of breath no vomiting no urinary symptoms no vaginal discharge or bleeding.  She has had similar pain in the past with ovarian cyst.  She states it is a similar location but that pain went away.  She is tried nothing for it.  She is a history of a cholecystectomy and abdominal partial hysterectomy.  She is also had ovarian cyst surgery.  The history is provided by the patient.  Abdominal Pain   This is a new problem. The current episode started 6 to 12 hours ago. The problem occurs constantly. The problem has been gradually worsening. The pain is associated with an unknown factor. The pain is located in the RLQ. The quality of the pain is sharp and shooting. The pain is at a severity of 10/10. Associated symptoms include diarrhea (1 episode). Pertinent negatives include anorexia, fever, hematochezia, melena, nausea, vomiting, constipation, dysuria, frequency, hematuria, headaches, arthralgias and myalgias. The symptoms are aggravated by activity. Nothing relieves the symptoms. Past workup does not include ultrasound or surgery.    Past Medical History:  Diagnosis Date  . Ovarian cyst     There are no active problems to display for this patient.   Past Surgical History:  Procedure Laterality Date  . ABDOMINAL HYSTERECTOMY     partial  . CHOLECYSTECTOMY    . OVARIAN CYST SURGERY       OB History   None      Home  Medications    Prior to Admission medications   Not on File    Family History No family history on file.  Social History Social History   Tobacco Use  . Smoking status: Current Some Day Smoker    Packs/day: 0.50    Types: Cigarettes  . Smokeless tobacco: Never Used  Substance Use Topics  . Alcohol use: No  . Drug use: No     Allergies   Patient has no known allergies.   Review of Systems Review of Systems  Constitutional: Positive for chills. Negative for fever.  HENT: Negative for ear pain and sore throat.   Eyes: Negative for pain and visual disturbance.  Respiratory: Negative for cough and shortness of breath.   Cardiovascular: Negative for chest pain and palpitations.  Gastrointestinal: Positive for abdominal pain and diarrhea (1 episode). Negative for anorexia, constipation, hematochezia, melena, nausea and vomiting.  Genitourinary: Negative for dysuria, frequency and hematuria.  Musculoskeletal: Negative for arthralgias, back pain and myalgias.  Skin: Negative for color change and rash.  Neurological: Positive for numbness. Negative for seizures, syncope and headaches.  All other systems reviewed and are negative.    Physical Exam Updated Vital Signs BP (!) 146/86 (BP Location: Left Arm)   Pulse 72   Temp 98.9 F (37.2 C) (Oral)   Resp 18   Ht 5\' 1"  (1.549 m)   Wt 78 kg (172 lb)  SpO2 100%   BMI 32.50 kg/m   Physical Exam  Constitutional: She appears well-developed and well-nourished.  HENT:  Head: Normocephalic and atraumatic.  Mouth/Throat: Oropharynx is clear and moist.  Eyes: Conjunctivae are normal.  Neck: Neck supple.  Cardiovascular: Normal rate, regular rhythm, normal heart sounds and intact distal pulses.  No murmur heard. Pulmonary/Chest: Effort normal and breath sounds normal. No respiratory distress.  Abdominal: Soft. Normal appearance. There is tenderness in the right lower quadrant. There is no rigidity, no rebound and no  guarding.  Musculoskeletal: She exhibits no edema, tenderness or deformity.  Neurological: She is alert. She has normal strength. No sensory deficit. GCS eye subscore is 4. GCS verbal subscore is 5. GCS motor subscore is 6.  Skin: Skin is warm and dry. Capillary refill takes less than 2 seconds.  Psychiatric: She has a normal mood and affect.  Nursing note and vitals reviewed.    ED Treatments / Results  Labs (all labs ordered are listed, but only abnormal results are displayed) Labs Reviewed  COMPREHENSIVE METABOLIC PANEL - Abnormal; Notable for the following components:      Result Value   ALT 11 (*)    Total Bilirubin 0.1 (*)    All other components within normal limits  LIPASE, BLOOD  CBC  URINALYSIS, ROUTINE W REFLEX MICROSCOPIC  PREGNANCY, URINE    EKG None  Radiology US Pelvic Complete W Transvaginal And Torsion R/o  Result Date: 05/31/2018 CLINICAL DATA:  Initial evaluation for acute right lower quadrant pain. History of prior hysterectomy. EXAM: TRANSABDOMINAL AND TRANSVAGINAL ULTRASOUND OF PELVIS DOPPLER ULTRASOUND OF OVARIES TECHNIQUE: Both transabdominal and transvaginal ultrasound examinations of the pelvis were performed. Transabdominal technique was performed for global imaging of the pelvis including uterus, ovaries, adnexal regions, and pelvic cul-de-sac. It was necessary to proceed with endovaginal exam following the transabdominal exam to visualize the uterus, endometrium, and ovaries. Color and duplex Doppler ultrasound was utilized to evaluate blood flow to the ovaries. COMPARISON:  Prior ultrasound from 06/13/2016. FINDINGS: Uterus Surgically absent.  No abnormality at the vaginal cuff. Endometrium Surgically absent. Right ovary Measurements: 3.9 x 3.1 x 2.6 cm. Multiple normal appearing follicles, with a 2.3 x 1.5 x 1.9 cm dominant follicle. Possible single thin internal septation versus 2 adjacent follicles/cysts. Left ovary Measurements: 2.7 x 2.1 x 2.2 cm. 2.9 x  1.1 x 1.5 cm somewhat elongated exophytic versus paraovarian simple cyst. Additional 1.5 x 1.2 x 2.1 cm exophytic versus paraovarian simple cyst noted as well. No significant internal complexity, vascularity, or other concerning findings within these lesions. Pulsed Doppler evaluation of both ovaries demonstrates normal low-resistance arterial and venous waveforms. Other findings Small volume free physiologic fluid within the pelvis. IMPRESSION: 1. Simple bilateral ovarian/adnexal cysts as above, measuring up to 2.3 cm on the right and 2.9 cm on the left. These are almost certainly benign, and no specific imaging follow up is recommended according to the Society of Radiologists in Ultrasound2010 Consensus Conference Statement (D Lenis Noon et al. Management of Asymptomatic Ovarian and Other Adnexal Cysts Imaged at [place]: Society of Radiologists in Ultrasound Consensus Conference Statement 2010. Radiology 256 (Sept 2010): 943-954.). 2. No evidence for associated torsion or other acute abnormality. 3. Small volume free physiologic fluid within the pelvis. 4. Status post hysterectomy. Electronically Signed   By: Rise Mu M.D.   On: 05/31/2018 22:09    Procedures Procedures (including critical care time)  Medications Ordered in ED Medications  sodium chloride 0.9 % bolus 1,000 mL (0  mLs Intravenous Stopped 05/31/18 2218)  morphine 4 MG/ML injection 4 mg (4 mg Intravenous Given 05/31/18 2117)  ketorolac (TORADOL) 30 MG/ML injection 30 mg (30 mg Intravenous Given 05/31/18 2231)  HYDROcodone-acetaminophen (NORCO/VICODIN) 5-325 MG per tablet 1 tablet (1 tablet Oral Given 05/31/18 2231)     Initial Impression / Assessment and Plan / ED Course  I have reviewed the triage vital signs and the nursing notes.  Pertinent labs & imaging results that were available during my care of the patient were reviewed by me and considered in my medical decision making (see chart for details).     Final Clinical  Impressions(s) / ED Diagnoses   Final diagnoses:  RLQ abdominal pain  Cysts of both ovaries    ED Discharge Orders    None       Terrilee FilesButler, Michael C, MD 06/01/18 1222

## 2018-05-31 NOTE — Discharge Instructions (Addendum)
You were evaluated in the emergency department for some right lower quadrant abdominal pain.  You had lab work urinalysis and ultrasound that did not show an obvious cause of your symptoms.  You did have a cyst on your right and left ovary but I am not sure that this is the cause of your pain.  We are prescribing you some anti-inflammatory and some pain medicine but will be important for you to follow-up with a gynecologist for further evaluation.  You should return to the emergency department for high fevers or worsening symptoms.
